# Patient Record
Sex: Female | Born: 1975 | Race: Black or African American | Hispanic: No | Marital: Single | Smoking: Former smoker
Health system: Southern US, Community
[De-identification: ages and names within clinical notes are randomized; demographics above are authoritative.]

## PROBLEM LIST (undated history)

## (undated) DIAGNOSIS — G8929 Other chronic pain: Secondary | ICD-10-CM

## (undated) DIAGNOSIS — E119 Type 2 diabetes mellitus without complications: Secondary | ICD-10-CM

## (undated) DIAGNOSIS — E785 Hyperlipidemia, unspecified: Secondary | ICD-10-CM

## (undated) DIAGNOSIS — F209 Schizophrenia, unspecified: Secondary | ICD-10-CM

## (undated) DIAGNOSIS — M549 Dorsalgia, unspecified: Secondary | ICD-10-CM

## (undated) HISTORY — PX: ABDOMINAL HYSTERECTOMY: SHX81

## (undated) HISTORY — PX: BACK SURGERY: SHX140

---

## 2008-01-21 ENCOUNTER — Inpatient Hospital Stay: Payer: Self-pay | Admitting: Psychiatry

## 2018-06-29 ENCOUNTER — Encounter (HOSPITAL_COMMUNITY): Payer: Self-pay | Admitting: Emergency Medicine

## 2018-06-29 ENCOUNTER — Ambulatory Visit (HOSPITAL_COMMUNITY)
Admission: EM | Admit: 2018-06-29 | Discharge: 2018-06-29 | Disposition: A | Discharge status: Home / Self Care | Payer: Medicare Other | Attending: Internal Medicine | Admitting: Internal Medicine

## 2018-06-29 DIAGNOSIS — M549 Dorsalgia, unspecified: Secondary | ICD-10-CM

## 2018-06-29 DIAGNOSIS — G8929 Other chronic pain: Secondary | ICD-10-CM

## 2018-06-29 HISTORY — DX: Other chronic pain: G89.29

## 2018-06-29 HISTORY — DX: Type 2 diabetes mellitus without complications: E11.9

## 2018-06-29 HISTORY — DX: Dorsalgia, unspecified: M54.9

## 2018-06-29 HISTORY — DX: Schizophrenia, unspecified: F20.9

## 2018-06-29 HISTORY — DX: Hyperlipidemia, unspecified: E78.5

## 2018-06-29 MED ORDER — KETOROLAC TROMETHAMINE 60 MG/2ML IM SOLN
INTRAMUSCULAR | Status: AC
  Filled 2018-06-29: qty 2

## 2018-06-29 MED ORDER — KETOROLAC TROMETHAMINE 60 MG/2ML IM SOLN
60.0000 mg | Freq: Once | INTRAMUSCULAR | Status: AC
  Administered 2018-06-29: 60 mg via INTRAMUSCULAR

## 2018-06-29 MED ORDER — CYCLOBENZAPRINE HCL 10 MG PO TABS: 10.0000 mg | ORAL_TABLET | Freq: Three times a day (TID) | ORAL | 0 refills | Status: DC

## 2018-06-29 NOTE — Discharge Instructions (Signed)
1- Take Tylenol 500 mg  2 every 6 hours for pain 2- You diabetes is not in good control, and the worse this is the more inflammation and pain you are going to have.  3- Use ice and heat on area of pain 20 minutes at a time 2-4 times a day 4- you need to walk more to help relieve your back pain, the more you sit, the more pressure there is on your discs and you back pain will just keep on getting worse.  5- call your family Dr and have a follow up on Friday.

## 2018-06-29 NOTE — ED Triage Notes (Signed)
Pt here for chronic lower back pain; pt sts toradol injection usually helps

## 2018-06-29 NOTE — ED Provider Notes (Signed)
[facility]    CSN: [id number] Arrival date & time: 06/29/18  1020     History   Chief Complaint Chief Complaint  Patient presents with  . Back Pain    HPI Melody Crawford is a 42 y.o. female.   Who presents with worse pain on lower back since last night. She denies an injury. Has not been able to get into her PCP today. Denies pain radiating to her legs.      Past Medical History:  Diagnosis Date  . Chronic back pain   . Diabetes mellitus without complication (HCC)   . Hyperlipidemia   . Schizophrenia (HCC)     There are no active problems to display for this patient.   Past Surgical History:  Procedure Laterality Date  . BACK SURGERY      OB History   None      Home Medications    Prior to Admission medications   Medication Sig Start Date End Date Taking? Authorizing Provider  GLIPIZIDE PO Take 2 tablets by mouth.   Yes [provider]  SITagliptin Phosphate (JANUVIA PO) Take by mouth.   Yes [provider]  cyclobenzaprine (FLEXERIL) 10 MG tablet Take 1 tablet (10 mg total) by mouth 3 (three) times daily. 06/29/18   Rodriguez-Southworth, Nettie Elm, PA-C    Family History History reviewed. No pertinent family history.  Social History Social History   Tobacco Use  . Smoking status: Current Every Day Smoker  . Smokeless tobacco: Never Used  Substance Use Topics  . Alcohol use: Not Currently  . Drug use: Never   Allergies   Patient has no known allergies.  Review of Systems Review of Systems  Constitutional: Negative for chills, diaphoresis and fever.  Gastrointestinal: Negative for abdominal pain.  Genitourinary: Negative for difficulty urinating, dysuria, frequency, hematuria, pelvic pain and urgency.  Musculoskeletal: Positive for back pain and myalgias.  Skin: Negative for color change, rash and wound.     Physical Exam Triage Vital Signs ED Triage Vitals [06/29/18 1129]  Enc Vitals Group     BP 138/79   Pulse Rate 84     Resp 18     Temp 98.1 F (36.7 C)     Temp Source Oral     SpO2 100 %     Weight      Height      Head Circumference      Peak Flow      Pain Score 10     Pain Loc      Pain Edu?      Excl. in GC?    No data found.  Updated Vital Signs BP 138/79 (BP Location: Right Arm)   Pulse 84   Temp 98.1 F (36.7 C) (Oral)   Resp 18   SpO2 100%   Visual Acuity Right Eye Distance:   Left Eye Distance:   Bilateral Distance:    Right Eye Near:   Left Eye Near:    Bilateral Near:     Physical Exam  Constitutional: She is oriented to person, place, and time. She appears well-developed. No distress.  HENT:  Head: Normocephalic.  Right Ear: External ear normal.  Left Ear: External ear normal.  Nose: Nose normal.  Eyes: Conjunctivae are normal. No scleral icterus.  Neck: Neck supple.  Pulmonary/Chest: Effort normal.  Musculoskeletal: She exhibits tenderness. She exhibits no edema or deformity.  BACK-Has a well healed scar on lumbar region. She is tender with just light pain  to lumbar soft tissue region. ROM is slow and decreased due to pain. Neg SLR.   Neurological: She is alert and oriented to person, place, and time. She displays normal reflexes. No sensory deficit. She exhibits normal muscle tone. Coordination normal.  Skin: Skin is warm. No rash noted. She is not diaphoretic. No erythema.  Psychiatric:  Has flat affect, would get easily irritated with he if I asked more details about her pain  Nursing note and vitals reviewed.  UC Treatments / Results  Labs (all labs ordered are listed, but only abnormal results are displayed) Labs Reviewed - No data to display  EKG None  Radiology No results found.  Procedures Procedures   Medications Ordered in UC Medications  ketorolac (TORADOL) injection 60 mg (60 mg Intramuscular Given 06/29/18 1230)    Initial Impression / Assessment and Plan / UC Course  I have reviewed the triage vital signs and the  nursing notes. Is having a flair if her chronic back pain and does not have any neurological deficit. I tried to find her med list from care everywhere but could not find it, I reviewed her labs and her last HGBA1C is 13.  She was given Toradol 60 mg IM. Rx for Flexeril was given. Advised to take Tylenol for pain. See instructions.     Final Clinical Impressions(s) / UC Diagnoses   Final diagnoses:  Chronic midline back pain, unspecified back location     Discharge Instructions     1- Take Tylenol 500 mg  2 every 6 hours for pain 2- You diabetes is not in good control, and the worse this is the more inflammation and pain you are going to have.  3- Use ice and heat on area of pain 20 minutes at a time 2-4 times a day 4- you need to walk more to help relieve your back pain, the more you sit, the more pressure there is on your discs and you back pain will just keep on getting worse.  5- call your family Dr and have a follow up on Friday.     ED Prescriptions    Medication Sig Dispense Auth. Provider   cyclobenzaprine (FLEXERIL) 10 MG tablet Take 1 tablet (10 mg total) by mouth 3 (three) times daily. 21 tablet Rodriguez-Southworth, Nettie ElmSylvia, PA-C     Controlled Substance Prescriptions [place] Controlled Substance Registry consulted?    Garey HamRodriguez-Southworth, Taige Housman, PA-C 06/29/18 2249

## 2019-12-22 ENCOUNTER — Encounter (HOSPITAL_COMMUNITY): Payer: Self-pay

## 2019-12-22 ENCOUNTER — Other Ambulatory Visit: Payer: Self-pay

## 2019-12-22 ENCOUNTER — Ambulatory Visit (HOSPITAL_COMMUNITY)
Admission: EM | Admit: 2019-12-22 | Discharge: 2019-12-22 | Disposition: A | Discharge status: Home / Self Care | Payer: Medicare Other | Attending: Family Medicine | Admitting: Family Medicine

## 2019-12-22 DIAGNOSIS — L0291 Cutaneous abscess, unspecified: Secondary | ICD-10-CM

## 2019-12-22 MED ORDER — CEPHALEXIN 500 MG PO CAPS: 500.0000 mg | ORAL_CAPSULE | Freq: Four times a day (QID) | ORAL | 0 refills | Status: DC

## 2019-12-22 NOTE — Discharge Instructions (Signed)
We drained a lot of infection of your abscess. Keep covered Placing on antibiotics Take medication as prescribed. Follow up as needed for continued or worsening symptoms

## 2019-12-22 NOTE — ED Triage Notes (Signed)
Pt presents with abscess under right arm since Saturday.

## 2019-12-23 DIAGNOSIS — L0291 Cutaneous abscess, unspecified: Secondary | ICD-10-CM | POA: Diagnosis not present

## 2019-12-23 NOTE — ED Provider Notes (Signed)
[facility]    CSN: [id number] Arrival date & time: 12/22/19  1115      History   Chief Complaint Chief Complaint  Patient presents with   Abscess    HPI Latoia Eyster is a 44 y.o. female.   Patient is a 44 year old female past medical history of chronic back pain, diabetes, hyperlipidemia, schizophrenia.  She presents today with abscess to right axilla.  Is been present and worsening over the past 5 days.  Very tender to palpation.  No specific drainage from the area.  Has not done any treat symptoms.  No fevers, chills, nausea, body aches.  Denies any known history of MRSA.  ROS per HPI      Past Medical History:  Diagnosis Date   Chronic back pain    Diabetes mellitus without complication (HCC)    Hyperlipidemia    Schizophrenia (HCC)     There are no problems to display for this patient.   Past Surgical History:  Procedure Laterality Date   BACK SURGERY      OB History   No obstetric history on file.      Home Medications    Prior to Admission medications   Medication Sig Start Date End Date Taking? Authorizing Provider  cephALEXin (KEFLEX) 500 MG capsule Take 1 capsule (500 mg total) by mouth 4 (four) times daily. 12/22/19   Dahlia Byes A, NP  cyclobenzaprine (FLEXERIL) 10 MG tablet Take 1 tablet (10 mg total) by mouth 3 (three) times daily. 06/29/18   Rodriguez-Southworth, Nettie Elm, PA-C  GLIPIZIDE PO Take 2 tablets by mouth.    [provider]  SITagliptin Phosphate (JANUVIA PO) Take by mouth.    [provider]    Family History Family History  Family history unknown: Yes    Social History Social History   Tobacco Use   Smoking status: Current Every Day Smoker   Smokeless tobacco: Never Used  Substance Use Topics   Alcohol use: Not Currently   Drug use: Never     Allergies   Patient has no known allergies.   Review of Systems Review of Systems   Physical Exam Triage Vital Signs ED Triage  Vitals  Enc Vitals Group     BP 12/22/19 1142 112/73     Pulse Rate 12/22/19 1142 84     Resp 12/22/19 1142 16     Temp 12/22/19 1142 98.7 F (37.1 C)     Temp Source 12/22/19 1142 Oral     SpO2 12/22/19 1142 100 %     Weight --      Height --      Head Circumference --      Peak Flow --      Pain Score 12/22/19 1144 8     Pain Loc --      Pain Edu? --      Excl. in GC? --    No data found.  Updated Vital Signs BP 112/73 (BP Location: Right Arm)    Pulse 84    Temp 98.7 F (37.1 C) (Oral)    Resp 16    SpO2 100%   Visual Acuity Right Eye Distance:   Left Eye Distance:   Bilateral Distance:    Right Eye Near:   Left Eye Near:    Bilateral Near:     Physical Exam Vitals and nursing note reviewed.  Constitutional:      General: She is not in acute distress.    Appearance: Normal  appearance. She is not ill-appearing, toxic-appearing or diaphoretic.  HENT:     Head: Normocephalic.     Nose: Nose normal.  Eyes:     Conjunctiva/sclera: Conjunctivae normal.  Pulmonary:     Effort: Pulmonary effort is normal.  Musculoskeletal:        General: Normal range of motion.     Cervical back: Normal range of motion.  Skin:    General: Skin is warm and dry.     Findings: No rash.     Comments: Approximated 5 to 6 cm x 4 cm abscess to right axilla with approximately 3 cm central fluctuance and surrounded induration and erythema.  No drainage.  Tender to palpation.  Neurological:     Mental Status: She is alert.  Psychiatric:        Mood and Affect: Mood normal.      UC Treatments / Results  Labs (all labs ordered are listed, but only abnormal results are displayed) Labs Reviewed - No data to display  EKG   Radiology No results found.  Procedures Incision and Drainage  Date/Time: 12/23/2019 11:29 AM Performed by: Janace Aris, NP Authorized by: Janace Aris, NP   Consent:    Consent obtained:  Verbal   Consent given by:  Patient   Risks discussed:   Bleeding, incomplete drainage, pain and damage to other organs   Alternatives discussed:  No treatment Universal protocol:    Patient identity confirmed:  Verbally with patient Location:    Type:  Abscess   Size:  5   Location:  Upper extremity   Upper extremity location:  Arm   Arm location:  R upper arm Pre-procedure details:    Skin preparation:  Betadine Anesthesia (see MAR for exact dosages):    Anesthesia method:  Local infiltration   Local anesthetic:  Lidocaine 2% w/o epi Procedure type:    Complexity:  Simple Procedure details:    Incision types:  Single straight   Incision depth:  Subcutaneous   Scalpel blade:  11   Wound management:  Probed and deloculated   Drainage:  Purulent   Drainage amount:  Copious   Wound treatment:  Wound left open   Packing materials:  None Post-procedure details:    Patient tolerance of procedure:  Tolerated well, no immediate complications   (including critical care time)  Medications Ordered in UC Medications - No data to display  Initial Impression / Assessment and Plan / UC Course  I have reviewed the triage vital signs and the nursing notes.  Pertinent labs & imaging results that were available during my care of the patient were reviewed by me and considered in my medical decision making (see chart for details).     Abscess Abscess I&D here in clinic with large amount of purulent drainage Patient tolerated well. Will place on Keflex for antibiotic coverage and have her follow-up for any continued or worsening problems Final Clinical Impressions(s) / UC Diagnoses   Final diagnoses:  Abscess     Discharge Instructions     We drained a lot of infection of your abscess. Keep covered Placing on antibiotics Take medication as prescribed. Follow up as needed for continued or worsening symptoms     ED Prescriptions    Medication Sig Dispense Auth. Provider   cephALEXin (KEFLEX) 500 MG capsule Take 1 capsule (500 mg  total) by mouth 4 (four) times daily. 28 capsule Khaza Blansett A, NP     PDMP not reviewed this  encounter.   Orvan July, NP 12/23/19 1130

## 2020-03-05 ENCOUNTER — Encounter (HOSPITAL_COMMUNITY): Payer: Self-pay

## 2020-03-05 ENCOUNTER — Other Ambulatory Visit: Payer: Self-pay

## 2020-03-05 ENCOUNTER — Ambulatory Visit (HOSPITAL_COMMUNITY)
Admission: EM | Admit: 2020-03-05 | Discharge: 2020-03-05 | Disposition: A | Discharge status: Home / Self Care | Payer: Medicare HMO | Attending: Urgent Care | Admitting: Urgent Care

## 2020-03-05 DIAGNOSIS — R300 Dysuria: Secondary | ICD-10-CM | POA: Insufficient documentation

## 2020-03-05 DIAGNOSIS — Z3202 Encounter for pregnancy test, result negative: Secondary | ICD-10-CM

## 2020-03-05 DIAGNOSIS — N898 Other specified noninflammatory disorders of vagina: Secondary | ICD-10-CM | POA: Diagnosis not present

## 2020-03-05 LAB — POCT URINALYSIS DIPSTICK, ED / UC
Bilirubin Urine: NEGATIVE
Glucose, UA: NEGATIVE mg/dL
Hgb urine dipstick: NEGATIVE
Ketones, ur: NEGATIVE mg/dL
Leukocytes,Ua: NEGATIVE
Nitrite: NEGATIVE
Protein, ur: 30 mg/dL — AB
Specific Gravity, Urine: >=1.03 (ref 1.005–1.030)
Urobilinogen, UA: 1 mg/dL (ref 0.0–1.0)
pH: 5.5 (ref 5.0–8.0)

## 2020-03-05 LAB — POC URINE PREG, ED: Preg Test, Ur: NEGATIVE

## 2020-03-05 MED ORDER — PHENAZOPYRIDINE HCL 200 MG PO TABS: 200.0000 mg | ORAL_TABLET | Freq: Three times a day (TID) | ORAL | 0 refills | Status: DC | PRN

## 2020-03-05 MED ORDER — METRONIDAZOLE 0.75 % VA GEL: 1.0000 | Freq: Two times a day (BID) | VAGINAL | 0 refills | Status: DC

## 2020-03-05 NOTE — ED Provider Notes (Signed)
[facility]   MRN: [id number] DOB: 1975-07-23  Subjective:   Melody Crawford is a 44 y.o. female presenting for 2 to 3-week history of persistent dysuria, vaginal itching, internal genital and vaginal irritation and slight discharge. Patient states that she usually has problems with yeast or BV. Has used Diflucan on Monday but states that her symptoms persist. Patient is a diabetic, states that she tries to hydrate very well but admits that she drinks 1-2 sodas a day, also drinks Powerade and another energy drink. She doesn't know of those are sugar-free either. States that she has no concerns about a sexually transmitted infection but is not opposed to testing.  No current facility-administered medications for this encounter.  Current Outpatient Medications:  .  cephALEXin (KEFLEX) 500 MG capsule, Take 1 capsule (500 mg total) by mouth 4 (four) times daily., Disp: 28 capsule, Rfl: 0 .  cyclobenzaprine (FLEXERIL) 10 MG tablet, Take 1 tablet (10 mg total) by mouth 3 (three) times daily., Disp: 21 tablet, Rfl: 0 .  GLIPIZIDE PO, Take 2 tablets by mouth., Disp: , Rfl:  .  SITagliptin Phosphate (JANUVIA PO), Take by mouth., Disp: , Rfl:    No Known Allergies  Past Medical History:  Diagnosis Date  . Chronic back pain   . Diabetes mellitus without complication (HCC)   . Hyperlipidemia   . Schizophrenia [facility])      Past Surgical History:  Procedure Laterality Date  . BACK SURGERY      Family History  Family history unknown: Yes    Social History   Tobacco Use  . Smoking status: Current Every Day Smoker  . Smokeless tobacco: Never Used  Substance Use Topics  . Alcohol use: Not Currently  . Drug use: Never    ROS   Objective:   Vitals: BP 105/67 (BP Location: Right Arm)   Pulse 91   Temp 98.6 F (37 C) (Oral)   Resp 16   SpO2 98%   Physical Exam Constitutional:      General: She is not in acute distress.    Appearance: Normal appearance. She is  well-developed. She is obese. She is not ill-appearing, toxic-appearing or diaphoretic.  HENT:     Head: Normocephalic and atraumatic.     Right Ear: External ear normal.     Left Ear: External ear normal.     Nose: Nose normal.     Mouth/Throat:     Mouth: Mucous membranes are moist.     Pharynx: Oropharynx is clear.  Eyes:     General: No scleral icterus.    Extraocular Movements: Extraocular movements intact.     Pupils: Pupils are equal, round, and reactive to light.  Cardiovascular:     Rate and Rhythm: Normal rate and regular rhythm.     Heart sounds: Normal heart sounds. No murmur heard.  No friction rub. No gallop.   Pulmonary:     Effort: Pulmonary effort is normal. No respiratory distress.     Breath sounds: Normal breath sounds. No stridor. No wheezing, rhonchi or rales.  Abdominal:     General: Bowel sounds are normal. There is no distension.     Palpations: Abdomen is soft. There is no mass.     Tenderness: There is abdominal tenderness (lower abdomen, generalized). There is no right CVA tenderness, left CVA tenderness, guarding or rebound.  Skin:    General: Skin is warm and dry.     Coloration: Skin is not pale.  Findings: No rash.  Neurological:     General: No focal deficit present.     Mental Status: She is alert and oriented to person, place, and time.  Psychiatric:        Mood and Affect: Mood normal.        Behavior: Behavior normal.        Thought Content: Thought content normal.        Judgment: Judgment normal.     Results for orders placed or performed during the hospital encounter of 03/05/20 (from the past 24 hour(s))  POC Urinalysis dipstick     Status: Abnormal   Collection Time: 03/05/20 11:13 AM  Result Value Ref Range   Glucose, UA NEGATIVE NEGATIVE mg/dL   Bilirubin Urine NEGATIVE NEGATIVE   Ketones, ur NEGATIVE NEGATIVE mg/dL   Specific Gravity, Urine >=1.030 1.005 - 1.030   Hgb urine dipstick NEGATIVE NEGATIVE   pH 5.5 5.0 - 8.0    Protein, ur 30 (A) NEGATIVE mg/dL   Urobilinogen, UA 1.0 0.0 - 1.0 mg/dL   Nitrite NEGATIVE NEGATIVE   Leukocytes,Ua NEGATIVE NEGATIVE  POC urine pregnancy     Status: None   Collection Time: 03/05/20 11:19 AM  Result Value Ref Range   Preg Test, Ur NEGATIVE NEGATIVE    Assessment and Plan :   PDMP not reviewed this encounter.  1. Vaginal itching   2. Dysuria     Emphasized need to cut out all urinary irritants including sodas, energy drinks. She is to hydrate with plain water only. Use Pyridium in the meantime for symptomatic relief. Given that she already try Diflucan will cover for bacterial vaginosis with [employer] at patient's request. Urine culture and STI testing, cervical swab testing pending. Counseled patient on potential for adverse effects with medications prescribed/recommended today, ER and return-to-clinic precautions discussed, patient verbalized understanding. Follow-up with PCP otherwise.    Wallis Bamberg, PA-C 03/05/20 1136

## 2020-03-05 NOTE — Discharge Instructions (Signed)
Make sure you hydrate very well with plain water and a quantity of 64 ounces of water a day.  Please limit drinks that are considered urinary irritants such as soda, sweet tea, coffee, energy drinks, alcohol.  These can worsen your symptoms and also be the source of them.  I will let you know about your urine culture and cervical swab results through MyChart to see if we need your antibiotics based off of those results.

## 2020-03-05 NOTE — ED Triage Notes (Signed)
Patient is here today with complaints of dysuria, burning urination and vaginal itching that has been oncoming for the past 2-3 weeks. Patient states her PCP Rx Diflucan 150 mg x 2 on Monday but she is still having the same symptoms.

## 2020-03-06 LAB — CERVICOVAGINAL ANCILLARY ONLY
Bacterial Vaginitis (gardnerella): POSITIVE — AB
Candida Glabrata: POSITIVE — AB
Candida Vaginitis: NEGATIVE
Chlamydia: NEGATIVE
Comment: NEGATIVE
Comment: NEGATIVE
Comment: NEGATIVE
Comment: NEGATIVE
Comment: NEGATIVE
Comment: NORMAL
Neisseria Gonorrhea: NEGATIVE
Trichomonas: NEGATIVE

## 2020-03-06 LAB — URINE CULTURE: Culture: 30000 — AB

## 2020-03-08 ENCOUNTER — Telehealth (HOSPITAL_COMMUNITY): Payer: Self-pay | Admitting: Emergency Medicine

## 2020-03-08 MED ORDER — FLUCONAZOLE 150 MG PO TABS: 150.0000 mg | ORAL_TABLET | Freq: Once | ORAL | 0 refills | Status: AC

## 2020-03-31 ENCOUNTER — Other Ambulatory Visit: Payer: Self-pay

## 2020-03-31 ENCOUNTER — Emergency Department (HOSPITAL_COMMUNITY): Payer: Medicare HMO

## 2020-03-31 ENCOUNTER — Emergency Department (HOSPITAL_COMMUNITY)
Admission: EM | Admit: 2020-03-31 | Discharge: 2020-03-31 | Disposition: A | Discharge status: Home / Self Care | Payer: Medicare HMO | Attending: Emergency Medicine | Admitting: Emergency Medicine

## 2020-03-31 ENCOUNTER — Encounter (HOSPITAL_COMMUNITY): Payer: Self-pay

## 2020-03-31 ENCOUNTER — Encounter (HOSPITAL_COMMUNITY): Payer: Self-pay | Admitting: *Deleted

## 2020-03-31 ENCOUNTER — Ambulatory Visit (HOSPITAL_COMMUNITY)
Admission: EM | Admit: 2020-03-31 | Discharge: 2020-03-31 | Disposition: A | Discharge status: Other Acute Inpatient Hospital | Payer: Medicare HMO

## 2020-03-31 DIAGNOSIS — Z5321 Procedure and treatment not carried out due to patient leaving prior to being seen by health care provider: Secondary | ICD-10-CM | POA: Insufficient documentation

## 2020-03-31 DIAGNOSIS — R32 Unspecified urinary incontinence: Secondary | ICD-10-CM

## 2020-03-31 DIAGNOSIS — R1084 Generalized abdominal pain: Secondary | ICD-10-CM | POA: Diagnosis not present

## 2020-03-31 DIAGNOSIS — M5442 Lumbago with sciatica, left side: Secondary | ICD-10-CM | POA: Diagnosis not present

## 2020-03-31 DIAGNOSIS — R200 Anesthesia of skin: Secondary | ICD-10-CM | POA: Insufficient documentation

## 2020-03-31 DIAGNOSIS — M7918 Myalgia, other site: Secondary | ICD-10-CM | POA: Insufficient documentation

## 2020-03-31 DIAGNOSIS — M48061 Spinal stenosis, lumbar region without neurogenic claudication: Secondary | ICD-10-CM

## 2020-03-31 DIAGNOSIS — G8929 Other chronic pain: Secondary | ICD-10-CM
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
## 2020-03-31 NOTE — ED Notes (Signed)
Patient is being discharged from the Urgent Care and sent to the Emergency Department via wheel chair w/UC staff . Per Leta Jungling, pa, patient is in need of higher level of care due to needing further testing. Patient is aware and verbalizes understanding of plan of care.  Vitals:   03/31/20 0918  BP: 112/77  Pulse: 82  Resp: 18  Temp: 97.9 F (36.6 C)  SpO2: 100%
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
## 2020-03-31 NOTE — ED Notes (Signed)
Pt extremely angry and aggressive with staff about wait time. Pt explained ED process, pt states she is going to UC. Pt LWBS

## 2020-03-31 NOTE — ED Notes (Signed)
Pt up to desk, verbally aggressive with staff. Pt informed of wait time and said that she will be leaving to go to urgent care to get her results since they are "in the system".
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
## 2020-04-01 ENCOUNTER — Other Ambulatory Visit: Payer: Self-pay

## 2020-04-01 ENCOUNTER — Encounter (HOSPITAL_COMMUNITY): Payer: Self-pay | Admitting: Emergency Medicine

## 2020-04-01 ENCOUNTER — Emergency Department (HOSPITAL_COMMUNITY)
Admission: EM | Admit: 2020-04-01 | Discharge: 2020-04-01 | Disposition: A | Discharge status: Home / Self Care | Payer: Medicare HMO | Attending: Emergency Medicine | Admitting: Emergency Medicine

## 2020-04-01 DIAGNOSIS — M79605 Pain in left leg: Secondary | ICD-10-CM | POA: Insufficient documentation

## 2020-04-01 DIAGNOSIS — Y999 Unspecified external cause status: Secondary | ICD-10-CM | POA: Insufficient documentation

## 2020-04-01 DIAGNOSIS — E119 Type 2 diabetes mellitus without complications: Secondary | ICD-10-CM | POA: Insufficient documentation

## 2020-04-01 DIAGNOSIS — Y92410 Unspecified street and highway as the place of occurrence of the external cause: Secondary | ICD-10-CM | POA: Insufficient documentation

## 2020-04-01 DIAGNOSIS — F17210 Nicotine dependence, cigarettes, uncomplicated: Secondary | ICD-10-CM | POA: Insufficient documentation

## 2020-04-01 DIAGNOSIS — M545 Low back pain: Secondary | ICD-10-CM | POA: Diagnosis not present

## 2020-04-01 DIAGNOSIS — R200 Anesthesia of skin: Secondary | ICD-10-CM | POA: Diagnosis not present

## 2020-04-01 DIAGNOSIS — Y939 Activity, unspecified: Secondary | ICD-10-CM | POA: Diagnosis not present

## 2020-04-01 DIAGNOSIS — Z7984 Long term (current) use of oral hypoglycemic drugs: Secondary | ICD-10-CM | POA: Diagnosis not present

## 2020-04-01 DIAGNOSIS — M5106 Intervertebral disc disorders with myelopathy, lumbar region: Secondary | ICD-10-CM

## 2020-04-01 LAB — URINALYSIS, ROUTINE W REFLEX MICROSCOPIC
Bilirubin Urine: NEGATIVE
Glucose, UA: >=500 mg/dL — AB
Hgb urine dipstick: NEGATIVE
Ketones, ur: NEGATIVE mg/dL
Leukocytes,Ua: NEGATIVE
Nitrite: NEGATIVE
Protein, ur: NEGATIVE mg/dL
Specific Gravity, Urine: 1.02 (ref 1.005–1.030)
pH: 6 (ref 5.0–8.0)

## 2020-04-01 LAB — URINALYSIS, MICROSCOPIC (REFLEX): RBC / HPF: NONE SEEN RBC/hpf (ref 0–5)

## 2020-04-01 MED ORDER — KETOROLAC TROMETHAMINE 15 MG/ML IJ SOLN
30.0000 mg | Freq: Once | INTRAMUSCULAR | Status: AC
  Administered 2020-04-01: 30 mg via INTRAMUSCULAR
  Filled 2020-04-01: qty 2

## 2020-04-01 MED ORDER — ORPHENADRINE CITRATE 30 MG/ML IJ SOLN
60.0000 mg | Freq: Two times a day (BID) | INTRAMUSCULAR | Status: DC
  Filled 2020-04-01: qty 2

## 2020-04-01 MED ORDER — METHOCARBAMOL 1000 MG/10ML IJ SOLN
500.0000 mg | Freq: Once | INTRAMUSCULAR | Status: AC
  Administered 2020-04-01: 500 mg via INTRAMUSCULAR
  Filled 2020-04-01 (×2): qty 5

## 2020-04-01 MED ORDER — METHOCARBAMOL 500 MG PO TABS: 500.0000 mg | ORAL_TABLET | Freq: Three times a day (TID) | ORAL | 0 refills | Status: DC | PRN
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
## 2020-04-01 NOTE — ED Triage Notes (Signed)
Pt. Stated, I was here yesterday and I left too long to wait.  Left AMA
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
## 2020-10-21 ENCOUNTER — Other Ambulatory Visit: Payer: Self-pay

## 2020-10-21 ENCOUNTER — Encounter (HOSPITAL_COMMUNITY): Payer: Self-pay | Admitting: *Deleted

## 2020-10-21 ENCOUNTER — Ambulatory Visit (HOSPITAL_COMMUNITY)
Admission: EM | Admit: 2020-10-21 | Discharge: 2020-10-21 | Disposition: A | Discharge status: Home / Self Care | Payer: Medicare Other | Attending: Emergency Medicine | Admitting: Emergency Medicine

## 2020-10-21 DIAGNOSIS — N898 Other specified noninflammatory disorders of vagina: Secondary | ICD-10-CM

## 2020-10-21 DIAGNOSIS — E1169 Type 2 diabetes mellitus with other specified complication: Secondary | ICD-10-CM | POA: Diagnosis not present

## 2020-10-21 DIAGNOSIS — R81 Glycosuria: Secondary | ICD-10-CM

## 2020-10-21 DIAGNOSIS — Z3202 Encounter for pregnancy test, result negative: Secondary | ICD-10-CM | POA: Diagnosis not present

## 2020-10-21 LAB — POCT URINALYSIS DIPSTICK, ED / UC
Bilirubin Urine: NEGATIVE
Glucose, UA: >=1000 mg/dL — AB
Hgb urine dipstick: NEGATIVE
Ketones, ur: NEGATIVE mg/dL
Leukocytes,Ua: NEGATIVE
Nitrite: NEGATIVE
Protein, ur: NEGATIVE mg/dL
Specific Gravity, Urine: 1.015 (ref 1.005–1.030)
Urobilinogen, UA: 0.2 mg/dL (ref 0.0–1.0)
pH: 5.5 (ref 5.0–8.0)

## 2020-10-21 LAB — CBG MONITORING, ED: Glucose-Capillary: 407 mg/dL — ABNORMAL HIGH (ref 70–99)

## 2020-10-21 LAB — POC URINE PREG, ED: Preg Test, Ur: NEGATIVE

## 2020-10-21 NOTE — Discharge Instructions (Signed)
Your discomfort is most likely due to the amount of sugar in your urine.    Take your glucose medication as prescribed.  Follow up with your primary care doctor.    If your lab work reveals any other infections, we will contact you and send in medication at that time.    Follow up with your Primary Care Provider or go to the Emergency Department if symptoms worsen or do not improve in the next few days.

## 2020-10-21 NOTE — ED Triage Notes (Signed)
Pt reports seeing a vag DC,itching.

## 2020-10-21 NOTE — ED Provider Notes (Signed)
[facility]    CSN: [id number] Arrival date & time: 10/21/20  1005      History   Chief Complaint Chief Complaint  Patient presents with  . Vaginal Discharge    HPI Melody Crawford is a 45 y.o. female.   Patient here with complaint of burning with urination and vaginal itching.  Patient with history of diabetes but is noncompliant with medications.  Has not checked blood sugars today.  Has not taken any OTC medications or treatments. Denies any specific alleviating or aggravating factors.  Denies any fevers, chest pain, shortness of breath, N/V/D, numbness, tingling, weakness, abdominal pain, or headaches.   ROS: As per HPI, all other pertinent ROS negative      Past Medical History:  Diagnosis Date  . Chronic back pain   . Diabetes mellitus without complication (HCC)   . Hyperlipidemia   . Schizophrenia (HCC)     There are no problems to display for this patient.   Past Surgical History:  Procedure Laterality Date  . BACK SURGERY      OB History   No obstetric history on file.      Home Medications    Prior to Admission medications   Medication Sig Start Date End Date Taking? Authorizing Provider  atorvastatin (LIPITOR) 20 MG tablet Take 1 mg by mouth daily. 01/07/20 01/06/21  [provider]  cyclobenzaprine (FLEXERIL) 10 MG tablet Take 1 tablet (10 mg total) by mouth 3 (three) times daily. 06/29/18   Rodriguez-Southworth, Nettie Elm, PA-C  GLIPIZIDE PO Take 2 tablets by mouth.    [provider]  INVEGA SUSTENNA 156 MG/ML SUSY injection Inject 156 mg into the muscle as directed. 03/01/20   [provider]  methocarbamol (ROBAXIN) 500 MG tablet Take 1 tablet (500 mg total) by mouth 3 (three) times daily as needed for muscle spasms. 04/01/20   Harris, Cammy Copa, PA-C  metroNIDAZOLE (METROGEL VAGINAL) 0.75 % vaginal gel Place 1 Applicatorful vaginally 2 (two) times daily. 03/05/20   Wallis Bamberg, PA-C  phenazopyridine (PYRIDIUM) 200 MG  tablet Take 1 tablet (200 mg total) by mouth 3 (three) times daily as needed for pain. 03/05/20   Wallis Bamberg, PA-C  SITagliptin Phosphate (JANUVIA PO) Take by mouth.    [provider]    Family History Family History  Problem Relation Age of Onset  . Kidney disease Father     Social History Social History   Tobacco Use  . Smoking status: Current Every Day Smoker    Packs/day: 1.00    Types: Cigarettes  . Smokeless tobacco: Never Used  Vaping Use  . Vaping Use: Never used  Substance Use Topics  . Alcohol use: Not Currently  . Drug use: Never     Allergies   Patient has no known allergies.   Review of Systems Review of Systems  Genitourinary: Positive for dysuria, vaginal discharge and vaginal pain.     Physical Exam Triage Vital Signs ED Triage Vitals  Enc Vitals Group     BP 10/21/20 1029 (!) 148/80     Pulse Rate 10/21/20 1029 79     Resp 10/21/20 1029 18     Temp 10/21/20 1029 98.2 F (36.8 C)     Temp Source 10/21/20 1029 Oral     SpO2 10/21/20 1029 99 %     Weight --      Height --      Head Circumference --      Peak Flow --  Pain Score 10/21/20 1030 10     Pain Loc --      Pain Edu? --      Excl. in GC? --    No data found.  Updated Vital Signs BP (!) 148/80 (BP Location: Right Arm)   Pulse 79   Temp 98.2 F (36.8 C) (Oral)   Resp 18   SpO2 99%   Visual Acuity Right Eye Distance:   Left Eye Distance:   Bilateral Distance:    Right Eye Near:   Left Eye Near:    Bilateral Near:     Physical Exam Vitals and nursing note reviewed.  Constitutional:      General: She is not in acute distress.    Appearance: Normal appearance. She is not ill-appearing, toxic-appearing or diaphoretic.  HENT:     Head: Normocephalic and atraumatic.  Eyes:     Conjunctiva/sclera: Conjunctivae normal.  Cardiovascular:     Rate and Rhythm: Normal rate.     Pulses: Normal pulses.  Pulmonary:     Effort: Pulmonary effort is normal.   Abdominal:     General: Abdomen is flat.  Genitourinary:    Comments: decline Musculoskeletal:        General: Normal range of motion.     Cervical back: Normal range of motion.  Skin:    General: Skin is warm and dry.  Neurological:     General: No focal deficit present.     Mental Status: She is alert and oriented to person, place, and time.  Psychiatric:        Mood and Affect: Mood normal.      UC Treatments / Results  Labs (all labs ordered are listed, but only abnormal results are displayed) Labs Reviewed  POCT URINALYSIS DIPSTICK, ED / UC - Abnormal; Notable for the following components:      Result Value   Glucose, UA >=1000 (*)    All other components within normal limits  CBG MONITORING, ED - Abnormal; Notable for the following components:   Glucose-Capillary 407 (*)    All other components within normal limits  POC URINE PREG, ED  CERVICOVAGINAL ANCILLARY ONLY    EKG   Radiology No results found.  Procedures Procedures (including critical care time)  Medications Ordered in UC Medications - No data to display  Initial Impression / Assessment and Plan / UC Course  I have reviewed the triage vital signs and the nursing notes.  Pertinent labs & imaging results that were available during my care of the patient were reviewed by me and considered in my medical decision making (see chart for details).     Vaginal itching Glucosuria Assessment negative for red flags or concerns. CBG 407 in office but has not taken medications this morning.  Patient reports does not take medications regularly, last A1c was 13. Urinalysis with glucose greater than 1000.  Likely cause of vaginal discomfort. Self swab obtained.  Will treat based on results. Encourage patient to take medications regularly as prescribed, which will help with glucosuria and discomfort. Follow-up with primary care Final Clinical Impressions(s) / UC Diagnoses   Final diagnoses:  Vaginal  itching  Glucosuria     Discharge Instructions     Your discomfort is most likely due to the amount of sugar in your urine.    Take your glucose medication as prescribed.  Follow up with your primary care doctor.    If your lab work reveals any other infections, we will contact you  and send in medication at that time.    Follow up with your Primary Care Provider or go to the Emergency Department if symptoms worsen or do not improve in the next few days.      ED Prescriptions    None     PDMP not reviewed this encounter.   Ivette Loyal, NP 10/21/20 [phone]

## 2020-10-22 LAB — CERVICOVAGINAL ANCILLARY ONLY
Bacterial Vaginitis (gardnerella): NEGATIVE
Candida Glabrata: POSITIVE — AB
Candida Vaginitis: POSITIVE — AB
Chlamydia: NEGATIVE
Comment: NEGATIVE
Comment: NEGATIVE
Comment: NEGATIVE
Comment: NEGATIVE
Comment: NEGATIVE
Comment: NORMAL
Neisseria Gonorrhea: NEGATIVE
Trichomonas: NEGATIVE

## 2020-10-23 ENCOUNTER — Telehealth (HOSPITAL_COMMUNITY): Payer: Self-pay | Admitting: Emergency Medicine

## 2020-10-23 MED ORDER — FLUCONAZOLE 150 MG PO TABS: 150.0000 mg | ORAL_TABLET | Freq: Once | ORAL | 0 refills | Status: AC

## 2021-06-09 ENCOUNTER — Ambulatory Visit (HOSPITAL_COMMUNITY)
Admission: EM | Admit: 2021-06-09 | Discharge: 2021-06-09 | Disposition: A | Discharge status: Home / Self Care | Payer: Medicare Other | Attending: Physician Assistant | Admitting: Physician Assistant

## 2021-06-09 ENCOUNTER — Other Ambulatory Visit: Payer: Self-pay

## 2021-06-09 ENCOUNTER — Encounter (HOSPITAL_COMMUNITY): Payer: Self-pay | Admitting: Emergency Medicine

## 2021-06-09 DIAGNOSIS — G8929 Other chronic pain: Secondary | ICD-10-CM

## 2021-06-09 DIAGNOSIS — M545 Low back pain, unspecified: Secondary | ICD-10-CM | POA: Diagnosis not present

## 2021-06-09 MED ORDER — KETOROLAC TROMETHAMINE 30 MG/ML IJ SOLN
30.0000 mg | Freq: Once | INTRAMUSCULAR | Status: AC
  Administered 2021-06-09: 30 mg via INTRAMUSCULAR

## 2021-06-09 MED ORDER — METHOCARBAMOL 500 MG PO TABS: 500.0000 mg | ORAL_TABLET | Freq: Three times a day (TID) | ORAL | 0 refills | Status: DC | PRN

## 2021-06-09 MED ORDER — KETOROLAC TROMETHAMINE 30 MG/ML IJ SOLN
INTRAMUSCULAR | Status: AC
  Filled 2021-06-09: qty 1

## 2021-06-09 NOTE — Discharge Instructions (Signed)
We gave you a shot of Toradol today so please avoid NSAIDs including aspirin, ibuprofen/Advil, naproxen/Aleve for 24 hours.  You can use Tylenol for breakthrough pain.  Start methocarbamol up to 3 times a day.  This make you sleepy so not drive drink alcohol with taking it.  Use heat, rest, stretch for additional symptom relief.  If you develop any worsening symptoms please return for reevaluation.  Recommend follow-up with your primary care provider first thing next week for evaluation and additional management.

## 2021-06-09 NOTE — ED Triage Notes (Signed)
Pt is present today with lower back pain. Pt states sx started Friday. Pt denies any urinary sx
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
## 2022-03-25 ENCOUNTER — Ambulatory Visit (HOSPITAL_COMMUNITY)
Admission: EM | Admit: 2022-03-25 | Discharge: 2022-03-25 | Disposition: A | Discharge status: Home / Self Care | Payer: Medicare Other | Attending: Physician Assistant | Admitting: Physician Assistant

## 2022-03-25 ENCOUNTER — Encounter (HOSPITAL_COMMUNITY): Payer: Self-pay

## 2022-03-25 DIAGNOSIS — R350 Frequency of micturition: Secondary | ICD-10-CM | POA: Diagnosis present

## 2022-03-25 LAB — POCT URINALYSIS DIPSTICK, ED / UC
Glucose, UA: >=1000 mg/dL — AB
Hgb urine dipstick: NEGATIVE
Ketones, ur: 80 mg/dL — AB
Leukocytes,Ua: NEGATIVE
Nitrite: NEGATIVE
Protein, ur: NEGATIVE mg/dL
Specific Gravity, Urine: 1.025 (ref 1.005–1.030)
Urobilinogen, UA: 0.2 mg/dL (ref 0.0–1.0)
pH: 6 (ref 5.0–8.0)

## 2022-03-25 LAB — CBG MONITORING, ED: Glucose-Capillary: 275 mg/dL — ABNORMAL HIGH (ref 70–99)

## 2022-03-25 NOTE — ED Triage Notes (Signed)
Pt is here for possible UTI , pt states she has urgency and frequency with pressure and back pain x7days

## 2022-03-25 NOTE — Discharge Instructions (Signed)
Drink plenty of water Follow up with primary care physician to continue working on better control of diabetes. Return for evaluation if you develop fever, nausea, vomiting.  Will call with lab results.

## 2022-03-25 NOTE — ED Provider Notes (Signed)
[facility]    CSN: [id number] Arrival date & time: 03/25/22  1552      History   Chief Complaint Chief Complaint  Patient presents with   Dysuria    HPI Laquonda Welby is a 46 y.o. female.   Pt complains of increased urinary frequency and urgency, lower back pain that started about 7 days ago.  Denies fever, chills, n/v/d.  She has chronic lower back pain, but is worried she may have a kidney infection.  She has poorly controlled diabetes with last A1C 11.9.  Has been referred to endocrinology.  She reports checking her sugar at home with readings in the 200-300s.     Past Medical History:  Diagnosis Date   Chronic back pain    Diabetes mellitus without complication (HCC)    Hyperlipidemia    Schizophrenia (HCC)     There are no problems to display for this patient.   Past Surgical History:  Procedure Laterality Date   BACK SURGERY      OB History   No obstetric history on file.      Home Medications    Prior to Admission medications   Medication Sig Start Date End Date Taking? Authorizing Provider  atorvastatin (LIPITOR) 20 MG tablet Take 1 mg by mouth daily. 01/07/20 01/06/21  [provider]  GLIPIZIDE PO Take 2 tablets by mouth.    [provider]  INVEGA SUSTENNA 156 MG/ML SUSY injection Inject 156 mg into the muscle as directed. 03/01/20   [provider]  methocarbamol (ROBAXIN) 500 MG tablet Take 1 tablet (500 mg total) by mouth every 8 (eight) hours as needed for muscle spasms. 06/09/21   Raspet, Noberto Retort, PA-C  phenazopyridine (PYRIDIUM) 200 MG tablet Take 1 tablet (200 mg total) by mouth 3 (three) times daily as needed for pain. 03/05/20   Wallis Bamberg, PA-C  SITagliptin Phosphate (JANUVIA PO) Take by mouth.    [provider]    Family History Family History  Problem Relation Age of Onset   Kidney disease Father     Social History Social History   Tobacco Use   Smoking status: Every Day    Packs/day:  1.00    Types: Cigarettes   Smokeless tobacco: Never  Vaping Use   Vaping Use: Never used  Substance Use Topics   Alcohol use: Not Currently   Drug use: Never     Allergies   Patient has no known allergies.   Review of Systems Review of Systems  Constitutional:  Negative for chills and fever.  HENT:  Negative for ear pain and sore throat.   Eyes:  Negative for pain and visual disturbance.  Respiratory:  Negative for cough and shortness of breath.   Cardiovascular:  Negative for chest pain and palpitations.  Gastrointestinal:  Negative for abdominal pain and vomiting.  Genitourinary:  Positive for frequency and urgency. Negative for dysuria and hematuria.  Musculoskeletal:  Positive for back pain. Negative for arthralgias.  Skin:  Negative for color change and rash.  Neurological:  Negative for seizures and syncope.  All other systems reviewed and are negative.    Physical Exam Triage Vital Signs ED Triage Vitals  Enc Vitals Group     BP 03/25/22 1743 117/66     Pulse Rate 03/25/22 1743 81     Resp 03/25/22 1743 12     Temp 03/25/22 1743 98.8 F (37.1 C)     Temp Source 03/25/22 1743 Oral  SpO2 03/25/22 1743 99 %     Weight 03/25/22 1739 189 lb (85.7 kg)     Height 03/25/22 1739 5\' 5"  (1.651 m)     Head Circumference --      Peak Flow --      Pain Score 03/25/22 1739 10     Pain Loc --      Pain Edu? --      Excl. in GC? --    No data found.  Updated Vital Signs BP 117/66 (BP Location: Left Arm)   Pulse 81   Temp 98.8 F (37.1 C) (Oral)   Resp 12   Ht 5\' 5"  (1.651 m)   Wt 189 lb (85.7 kg)   SpO2 99%   BMI 31.45 kg/m   Visual Acuity Right Eye Distance:   Left Eye Distance:   Bilateral Distance:    Right Eye Near:   Left Eye Near:    Bilateral Near:     Physical Exam Vitals and nursing note reviewed.  Constitutional:      General: She is not in acute distress.    Appearance: She is well-developed.  HENT:     Head: Normocephalic and  atraumatic.  Eyes:     Conjunctiva/sclera: Conjunctivae normal.  Cardiovascular:     Rate and Rhythm: Normal rate and regular rhythm.     Heart sounds: No murmur heard. Pulmonary:     Effort: Pulmonary effort is normal. No respiratory distress.     Breath sounds: Normal breath sounds.  Abdominal:     Palpations: Abdomen is soft.     Tenderness: There is no abdominal tenderness.  Musculoskeletal:        General: No swelling.     Cervical back: Neck supple.  Skin:    General: Skin is warm and dry.     Capillary Refill: Capillary refill takes less than 2 seconds.  Neurological:     Mental Status: She is alert.  Psychiatric:        Mood and Affect: Mood normal.      UC Treatments / Results  Labs (all labs ordered are listed, but only abnormal results are displayed) Labs Reviewed  POCT URINALYSIS DIPSTICK, ED / UC - Abnormal; Notable for the following components:      Result Value   Glucose, UA >=1000 (*)    Bilirubin Urine SMALL (*)    Ketones, ur 80 (*)    All other components within normal limits  CBG MONITORING, ED - Abnormal; Notable for the following components:   Glucose-Capillary 275 (*)    All other components within normal limits  URINE CULTURE  POCT URINALYSIS DIPSTICK, ED / UC    EKG   Radiology No results found.  Procedures Procedures (including critical care time)  Medications Ordered in UC Medications - No data to display  Initial Impression / Assessment and Plan / UC Course  I have reviewed the triage vital signs and the nursing notes.  Pertinent labs & imaging results that were available during my care of the patient were reviewed by me and considered in my medical decision making (see chart for details).     UA with no signs on infection, will send out for culture.  Discussed importance of better diabetes control.  Advised follow up with PCP.  ED precautions dicussed.  Final Clinical Impressions(s) / UC Diagnoses   Final diagnoses:   Increased urinary frequency     Discharge Instructions      Drink plenty of  water Follow up with primary care physician to continue working on better control of diabetes. Return for evaluation if you develop fever, nausea, vomiting.  Will call with lab results.    ED Prescriptions   None    PDMP not reviewed this encounter.   Ward, Tylene Fantasia, PA-C 03/25/22 1816

## 2022-03-26 LAB — URINE CULTURE: Culture: <10000 — AB

## 2022-05-16 ENCOUNTER — Ambulatory Visit (HOSPITAL_COMMUNITY)
Admission: EM | Admit: 2022-05-16 | Discharge: 2022-05-16 | Disposition: A | Discharge status: Home / Self Care | Payer: Medicare Other | Attending: Physician Assistant | Admitting: Physician Assistant

## 2022-05-16 ENCOUNTER — Encounter (HOSPITAL_COMMUNITY): Payer: Self-pay

## 2022-05-16 DIAGNOSIS — H66002 Acute suppurative otitis media without spontaneous rupture of ear drum, left ear: Secondary | ICD-10-CM | POA: Diagnosis not present

## 2022-05-16 DIAGNOSIS — H60502 Unspecified acute noninfective otitis externa, left ear: Secondary | ICD-10-CM

## 2022-05-16 MED ORDER — OFLOXACIN 0.3 % OT SOLN: 5.0000 [drp] | Freq: Two times a day (BID) | OTIC | 0 refills | Status: DC

## 2022-05-16 MED ORDER — AMOXICILLIN-POT CLAVULANATE 875-125 MG PO TABS: 1.0000 | ORAL_TABLET | Freq: Two times a day (BID) | ORAL | 0 refills | Status: DC

## 2022-05-16 NOTE — ED Provider Notes (Signed)
[facility]    CSN: [id number] Arrival date & time: 05/16/22  0801      History   Chief Complaint Chief Complaint  Patient presents with   Otalgia    HPI Melody Crawford is a 46 y.o. female.   Patient presents today with a 1 day history of severe left otalgia.  Reports pain is rated 10 on a 0-10 pain scale, described as sharp, no aggravating or alleviating factors identified.  She denies any recent illness but has had congestion for several weeks which she attributed to allergies.  She has been taking Sudafed and antihistamines as needed.  Has not tried additional over-the-counter medication.  Does report that she uses Q-tips and believes that she might have injured herself as she had blood after using this a few days ago.  Since then her pain has worsened and she is having difficulty hearing.  She denies any recent airplane travel or swimming.  She denies any recent antibiotic use.  She does have a history of diabetes with associated hyperglycemia; last A1c obtained in August 2023 11.9%    Past Medical History:  Diagnosis Date   Chronic back pain    Diabetes mellitus without complication (HCC)    Hyperlipidemia    Schizophrenia (HCC)     There are no problems to display for this patient.   Past Surgical History:  Procedure Laterality Date   BACK SURGERY      OB History   No obstetric history on file.      Home Medications    Prior to Admission medications   Medication Sig Start Date End Date Taking? Authorizing Provider  amoxicillin-clavulanate (AUGMENTIN) 875-125 MG tablet Take 1 tablet by mouth every 12 (twelve) hours. 05/16/22  Yes Reo Portela K, PA-C  ofloxacin (FLOXIN) 0.3 % OTIC solution Place 5 drops into the left ear 2 (two) times daily. 05/16/22  Yes Ryder Man K, PA-C  atorvastatin (LIPITOR) 20 MG tablet Take 1 mg by mouth daily. 01/07/20 01/06/21  [provider]  GLIPIZIDE PO Take 2 tablets by mouth.    [provider]   INVEGA SUSTENNA 156 MG/ML SUSY injection Inject 156 mg into the muscle as directed. 03/01/20   [provider]  methocarbamol (ROBAXIN) 500 MG tablet Take 1 tablet (500 mg total) by mouth every 8 (eight) hours as needed for muscle spasms. 06/09/21   Layal Javid, Noberto Retort, PA-C  phenazopyridine (PYRIDIUM) 200 MG tablet Take 1 tablet (200 mg total) by mouth 3 (three) times daily as needed for pain. 03/05/20   Wallis Bamberg, PA-C  SITagliptin Phosphate (JANUVIA PO) Take by mouth.    [provider]    Family History Family History  Problem Relation Age of Onset   Kidney disease Father     Social History Social History   Tobacco Use   Smoking status: Every Day    Packs/day: 1.00    Types: Cigarettes   Smokeless tobacco: Never  Vaping Use   Vaping Use: Never used  Substance Use Topics   Alcohol use: Not Currently   Drug use: Never     Allergies   Patient has no known allergies.   Review of Systems Review of Systems  Constitutional:  Positive for activity change. Negative for appetite change, fatigue and fever.  HENT:  Positive for congestion, ear pain and hearing loss. Negative for sinus pressure, sneezing and sore throat.   Respiratory:  Negative for cough and shortness of breath.   Cardiovascular:  Negative  for chest pain.  Gastrointestinal:  Negative for abdominal pain, diarrhea, nausea and vomiting.  Neurological:  Negative for dizziness, light-headedness and headaches.     Physical Exam Triage Vital Signs ED Triage Vitals  Enc Vitals Group     BP 05/16/22 0817 106/74     Pulse Rate 05/16/22 0817 77     Resp 05/16/22 0817 12     Temp 05/16/22 0817 97.6 F (36.4 C)     Temp Source 05/16/22 0817 Oral     SpO2 05/16/22 0817 98 %     Weight --      Height --      Head Circumference --      Peak Flow --      Pain Score 05/16/22 0814 10     Pain Loc --      Pain Edu? --      Excl. in [place]? --    No data found.  Updated Vital Signs BP 106/74 (BP  Location: Left Arm)   Pulse 77   Temp 97.6 F (36.4 C) (Oral)   Resp 12   SpO2 98%   Visual Acuity Right Eye Distance:   Left Eye Distance:   Bilateral Distance:    Right Eye Near:   Left Eye Near:    Bilateral Near:     Physical Exam Vitals reviewed.  Constitutional:      General: She is awake. She is not in acute distress.    Appearance: Normal appearance. She is well-developed. She is not ill-appearing.     Comments: Very pleasant female appears stated age in no acute distress  HENT:     Head: Normocephalic and atraumatic.     Right Ear: Tympanic membrane, ear canal and external ear normal. Tympanic membrane is not erythematous or bulging.     Left Ear: Ear canal and external ear normal. Swelling and tenderness present. Tympanic membrane is erythematous. Tympanic membrane is not bulging.     Ears:     Comments: Significant swelling and drainage noted left external ear canal.  Tenderness palpation with manipulation of external ear.  Erythematous TM; able to visualize approximately 40% without obvious perforation.    Nose:     Right Sinus: Maxillary sinus tenderness present. No frontal sinus tenderness.     Left Sinus: Maxillary sinus tenderness and frontal sinus tenderness present.     Mouth/Throat:     Pharynx: Uvula midline. No oropharyngeal exudate or posterior oropharyngeal erythema.  Cardiovascular:     Rate and Rhythm: Normal rate and regular rhythm.     Heart sounds: Normal heart sounds, S1 normal and S2 normal. No murmur heard. Pulmonary:     Effort: Pulmonary effort is normal.     Breath sounds: Normal breath sounds. No wheezing, rhonchi or rales.     Comments: Clear to auscultation bilaterally Psychiatric:        Behavior: Behavior is cooperative.      UC Treatments / Results  Labs (all labs ordered are listed, but only abnormal results are displayed) Labs Reviewed - No data to display  EKG   Radiology No results found.  Procedures Procedures  (including critical care time)  Medications Ordered in UC Medications - No data to display  Initial Impression / Assessment and Plan / UC Course  I have reviewed the triage vital signs and the nursing notes.  Pertinent labs & imaging results that were available during my care of the patient were reviewed by me and considered in my  medical decision making (see chart for details).     Otitis media and externa noted on exam.  Discussed that patient should avoid putting anything in her ear including Q-tips.  We will treat with fluoroquinolone given history of uncontrolled diabetes for otitis externa.  Ofloxacin sent to pharmacy and she was instructed to keep her ear facing upward several minutes after applying medication to allow complete penetration of your canal.  She was also started on Augmentin given otitis media.  Discussed that she can use over-the-counter medication including Tylenol and ibuprofen for symptom relief.  She is to continue allergy medication and recommended that she use a daily antihistamine as well as Flonase for symptom relief.  She is to rest and drink plenty of fluid.  Discussed that if her symptoms are not improving quickly she should follow-up with ENT and was given contact information for local provider with instruction to call to schedule an appointment.  Strict return precautions given.  Final Clinical Impressions(s) / UC Diagnoses   Final diagnoses:  Non-recurrent acute suppurative otitis media of left ear without spontaneous rupture of tympanic membrane  Acute otitis externa of left ear, unspecified type     Discharge Instructions      Start Augmentin twice daily for 7 days.  It is important that he continue your allergy medication including a daily antihistamine as well as Flonase which is a nasal spray available over-the-counter.  Use ofloxacin drops twice daily for 10 days.  Keep ear facing up for several minutes after applying drops to allow the medication to  get all the way into the ear canal.  Do not submerge her head underwater or put anything in your ears until symptoms resolve.  If your symptoms or not improving quickly please follow-up with ear nose and throat; call to schedule an appointment.  If you have any worsening symptoms you need to be seen immediately.     ED Prescriptions     Medication Sig Dispense Auth. Provider   amoxicillin-clavulanate (AUGMENTIN) 875-125 MG tablet Take 1 tablet by mouth every 12 (twelve) hours. 14 tablet Julya Alioto K, PA-C   ofloxacin (FLOXIN) 0.3 % OTIC solution Place 5 drops into the left ear 2 (two) times daily. 10 mL Jacqulin Brandenburger K, PA-C      PDMP not reviewed this encounter.   Terrilee Croak, PA-C 05/16/22 [id number]

## 2022-05-16 NOTE — Discharge Instructions (Signed)
Start Augmentin twice daily for 7 days.  It is important that he continue your allergy medication including a daily antihistamine as well as Flonase which is a nasal spray available over-the-counter.  Use ofloxacin drops twice daily for 10 days.  Keep ear facing up for several minutes after applying drops to allow the medication to get all the way into the ear canal.  Do not submerge her head underwater or put anything in your ears until symptoms resolve.  If your symptoms or not improving quickly please follow-up with ear nose and throat; call to schedule an appointment.  If you have any worsening symptoms you need to be seen immediately.

## 2022-05-16 NOTE — ED Triage Notes (Signed)
Pt is here for left ear pain since yesterday

## 2022-08-04 ENCOUNTER — Ambulatory Visit
Admission: EM | Admit: 2022-08-04 | Discharge: 2022-08-04 | Disposition: A | Discharge status: Home / Self Care | Payer: 59 | Attending: Emergency Medicine | Admitting: Emergency Medicine

## 2022-08-04 DIAGNOSIS — H1032 Unspecified acute conjunctivitis, left eye: Secondary | ICD-10-CM | POA: Diagnosis not present

## 2022-08-04 MED ORDER — POLYMYXIN B-TRIMETHOPRIM 10000-0.1 UNIT/ML-% OP SOLN: 1.0000 [drp] | Freq: Four times a day (QID) | OPHTHALMIC | 0 refills | Status: AC

## 2022-08-04 NOTE — Discharge Instructions (Addendum)
Use the antibiotic eyedrops as prescribed.  Follow up with your primary care provider if your symptoms are not improving.   ? ? ?

## 2022-08-04 NOTE — ED Triage Notes (Signed)
Patient to Urgent Care with complaints of left eye drainage/ redness. Sensitive to light. Continuously watering.   Symptoms started on Friday. Reports she was exposed to conjunctivitis.

## 2022-08-04 NOTE — ED Provider Notes (Signed)
Melody Crawford    CSN: [id number] Arrival date & time: 08/04/22  4098      History   Chief Complaint Chief Complaint  Patient presents with   Eye Drainage    HPI Melody Crawford is a 47 y.o. female.  Patient presents with 2 day history of left eye redness, itching, and drainage.  She denies eye injury, change in vision, eye pain, fever, chills, ear pain, sore throat, cough, or other symptoms.  No treatment at home.  Her medical history includes diabetes, hyperlipidemia, schizophrenia, chronic back pain.    The history is provided by the patient and medical records.    Past Medical History:  Diagnosis Date   Chronic back pain    Diabetes mellitus without complication (HCC)    Hyperlipidemia    Schizophrenia (HCC)     There are no problems to display for this patient.   Past Surgical History:  Procedure Laterality Date   BACK SURGERY      OB History   No obstetric history on file.      Home Medications    Prior to Admission medications   Medication Sig Start Date End Date Taking? Authorizing Provider  trimethoprim-polymyxin b (POLYTRIM) ophthalmic solution Place 1 drop into both eyes 4 (four) times daily for 7 days. 08/04/22 08/11/22 Yes Mickie Bail, NP  atorvastatin (LIPITOR) 20 MG tablet Take 1 mg by mouth daily. 01/07/20 01/06/21  [provider]  GLIPIZIDE PO Take 2 tablets by mouth.    [provider]  INVEGA SUSTENNA 156 MG/ML SUSY injection Inject 156 mg into the muscle as directed. 03/01/20   [provider]  methocarbamol (ROBAXIN) 500 MG tablet Take 1 tablet (500 mg total) by mouth every 8 (eight) hours as needed for muscle spasms. 06/09/21   Raspet, Noberto Retort, PA-C  phenazopyridine (PYRIDIUM) 200 MG tablet Take 1 tablet (200 mg total) by mouth 3 (three) times daily as needed for pain. 03/05/20   Wallis Bamberg, PA-C  SITagliptin Phosphate (JANUVIA PO) Take by mouth.    [provider]    Family History Family History   Problem Relation Age of Onset   Kidney disease Father     Social History Social History   Tobacco Use   Smoking status: Former    Packs/day: 1.00    Types: Cigarettes   Smokeless tobacco: Never  Vaping Use   Vaping Use: Never used  Substance Use Topics   Alcohol use: Not Currently   Drug use: Never     Allergies   Patient has no known allergies.   Review of Systems Review of Systems  Constitutional:  Negative for chills and fever.  HENT:  Negative for ear pain and sore throat.   Eyes:  Positive for discharge, redness and itching. Negative for pain and visual disturbance.  Respiratory:  Negative for cough and shortness of breath.   All other systems reviewed and are negative.    Physical Exam Triage Vital Signs ED Triage Vitals  Enc Vitals Group     BP 08/04/22 0919 105/73     Pulse Rate 08/04/22 0908 91     Resp 08/04/22 0908 18     Temp 08/04/22 0908 98 F (36.7 C)     Temp src --      SpO2 08/04/22 0908 98 %     Weight 08/04/22 0918 187 lb (84.8 kg)     Height 08/04/22 0918 5\' 4"  (1.626 m)     Head Circumference --  Peak Flow --      Pain Score 08/04/22 0917 10     Pain Loc --      Pain Edu? --      Excl. in [place]? --    No data found.  Updated Vital Signs BP 105/73   Pulse 91   Temp 98 F (36.7 C)   Resp 18   Ht 5\' 4"  (1.626 m)   Wt 187 lb (84.8 kg)   SpO2 98%   BMI 32.10 kg/m   Visual Acuity Right Eye Distance:   Left Eye Distance:   Bilateral Distance:    Right Eye Near:   Left Eye Near:    Bilateral Near:     Physical Exam Vitals and nursing note reviewed.  Constitutional:      General: She is not in acute distress.    Appearance: Normal appearance. She is well-developed. She is not ill-appearing.  HENT:     Right Ear: Tympanic membrane normal.     Left Ear: Tympanic membrane normal.     Nose: Nose normal.     Mouth/Throat:     Mouth: Mucous membranes are moist.     Pharynx: Oropharynx is clear.  Eyes:     General:  Lids are normal. Vision grossly intact.     Extraocular Movements: Extraocular movements intact.     Conjunctiva/sclera:     Left eye: Left conjunctiva is injected.     Pupils: Pupils are equal, round, and reactive to light.  Cardiovascular:     Rate and Rhythm: Normal rate and regular rhythm.     Heart sounds: Normal heart sounds.  Pulmonary:     Effort: Pulmonary effort is normal. No respiratory distress.     Breath sounds: Normal breath sounds.  Musculoskeletal:     Cervical back: Neck supple.  Skin:    General: Skin is warm and dry.  Neurological:     Mental Status: She is alert.  Psychiatric:        Mood and Affect: Mood normal.        Behavior: Behavior normal.      UC Treatments / Results  Labs (all labs ordered are listed, but only abnormal results are displayed) Labs Reviewed - No data to display  EKG   Radiology No results found.  Procedures Procedures (including critical care time)  Medications Ordered in UC Medications - No data to display  Initial Impression / Assessment and Plan / UC Course  I have reviewed the triage vital signs and the nursing notes.  Pertinent labs & imaging results that were available during my care of the patient were reviewed by me and considered in my medical decision making (see chart for details).    Acute bacterial conjunctivitis of left eye.  Treating with Polytrim eyedrops.  Education provided on conjunctivitis.  Instructed patient to follow-up with her PCP or eye care provider if her symptoms are not improving.  Patient agrees to plan of care.   Final Clinical Impressions(s) / UC Diagnoses   Final diagnoses:  Acute bacterial conjunctivitis of left eye     Discharge Instructions      Use the antibiotic eyedrops as prescribed.    Follow-up with your primary care provider if your symptoms are not improving.        ED Prescriptions     Medication Sig Dispense Auth. Provider   trimethoprim-polymyxin b  (POLYTRIM) ophthalmic solution Place 1 drop into both eyes 4 (four) times daily for 7 days.  10 mL Sharion Balloon, NP      PDMP not reviewed this encounter.   Sharion Balloon, NP 08/04/22 [phone]

## 2022-10-13 ENCOUNTER — Encounter (HOSPITAL_COMMUNITY): Payer: Self-pay

## 2022-10-13 ENCOUNTER — Ambulatory Visit (HOSPITAL_COMMUNITY)
Admission: EM | Admit: 2022-10-13 | Discharge: 2022-10-13 | Disposition: A | Discharge status: Home / Self Care | Payer: 59 | Attending: Nurse Practitioner | Admitting: Nurse Practitioner

## 2022-10-13 DIAGNOSIS — N760 Acute vaginitis: Secondary | ICD-10-CM

## 2022-10-13 LAB — HIV ANTIBODY (ROUTINE TESTING W REFLEX): HIV Screen 4th Generation wRfx: NONREACTIVE

## 2022-10-13 MED ORDER — FLUCONAZOLE 150 MG PO TABS: 150.0000 mg | ORAL_TABLET | Freq: Every day | ORAL | 0 refills | Status: DC

## 2022-10-13 NOTE — ED Triage Notes (Signed)
Patient here for vaginal itching and burning since after having intercourse on 08/27/2022. She has not been sexually active since. Symptoms started the day after. She states that she thinks that she has some vaginal swelling as well. She is also diabetic. She has used Vaseline but not helping.

## 2022-10-13 NOTE — ED Provider Notes (Signed)
[facility]    CSN: [id number] Arrival date & time: 10/13/22  1353      History   Chief Complaint Chief Complaint  Patient presents with   Vaginal Itching    HPI Melody Crawford is a 47 y.o. female.   Patient presents today for 6-week history of vaginal itching and irritation.  Reports symptoms began after last sexual encounter which was approximately 08/27/2022 and did not use protection.  She reports she has a thin, milky, white discharge as well.  No clumpy discharge.  She feels her labia are swollen and also burns when she urinates.  No open sores, rashes, or lesions that she knows of.  No groin lymphadenopathy, abdominal pain, nausea/vomiting.  Reports a history of diabetes and her blood sugars have been elevated since this began.  No urinary frequency, urgency, or urinary odor.  No pelvic pain or vaginal bleeding.  Reports she had hysterectomy years ago.    Past Medical History:  Diagnosis Date   Chronic back pain    Diabetes mellitus without complication ([place])    Hyperlipidemia    Schizophrenia ([place])     There are no problems to display for this patient.   Past Surgical History:  Procedure Laterality Date   BACK SURGERY      OB History   No obstetric history on file.      Home Medications    Prior to Admission medications   Medication Sig Start Date End Date Taking? Authorizing Provider  fluconazole (DIFLUCAN) 150 MG tablet Take 1 tablet (150 mg total) by mouth daily. 10/13/22  Yes Noemi Chapel A, NP  GLIPIZIDE PO Take 2 tablets by mouth.   Yes [provider]  INVEGA SUSTENNA 156 MG/ML SUSY injection Inject 156 mg into the muscle as directed. 03/01/20  Yes [provider]  methocarbamol (ROBAXIN) 500 MG tablet Take 1 tablet (500 mg total) by mouth every 8 (eight) hours as needed for muscle spasms. 06/09/21  Yes Raspet, Erin K, PA-C  phenazopyridine (PYRIDIUM) 200 MG tablet Take 1 tablet (200 mg total) by mouth 3 (three) times  daily as needed for pain. 03/05/20  Yes Jaynee Eagles, PA-C  SITagliptin Phosphate (JANUVIA PO) Take by mouth.   Yes [provider]  atorvastatin (LIPITOR) 20 MG tablet Take 1 mg by mouth daily. 01/07/20 01/06/21  [provider]    Family History Family History  Problem Relation Age of Onset   Kidney disease Father     Social History Social History   Tobacco Use   Smoking status: Former    Packs/day: 1    Types: Cigarettes   Smokeless tobacco: Never  Vaping Use   Vaping Use: Never used  Substance Use Topics   Alcohol use: Not Currently   Drug use: Never     Allergies   Patient has no known allergies.   Review of Systems Review of Systems Per HPI  Physical Exam Triage Vital Signs ED Triage Vitals  Enc Vitals Group     BP 10/13/22 1513 111/77     Pulse Rate 10/13/22 1513 90     Resp 10/13/22 1513 16     Temp 10/13/22 1513 98.6 F (37 C)     Temp Source 10/13/22 1513 Oral     SpO2 10/13/22 1513 96 %     Weight 10/13/22 1512 187 lb (84.8 kg)     Height 10/13/22 1512 5\' 4"  (1.626 m)     Head Circumference --  Peak Flow --      Pain Score 10/13/22 1512 10     Pain Loc --      Pain Edu? --      Excl. in [place]? --    No data found.  Updated Vital Signs BP 111/77 (BP Location: Left Arm)   Pulse 90   Temp 98.6 F (37 C) (Oral)   Resp 16   Ht 5\' 4"  (1.626 m)   Wt 187 lb (84.8 kg)   SpO2 96%   BMI 32.10 kg/m   Visual Acuity Right Eye Distance:   Left Eye Distance:   Bilateral Distance:    Right Eye Near:   Left Eye Near:    Bilateral Near:     Physical Exam Vitals and nursing note reviewed. Exam conducted with a chaperone present Janace Hoard, CMA).  Constitutional:      General: She is not in acute distress.    Appearance: Normal appearance. She is not toxic-appearing.  Pulmonary:     Effort: Pulmonary effort is normal. No respiratory distress.  Genitourinary:    Exam position: Lithotomy position.     Pubic Area: No rash  or pubic lice.      Labia:        Right: Tenderness present. No rash, lesion or injury.        Left: Tenderness present. No rash, lesion or injury.      Vagina: Vaginal discharge present. No erythema or bleeding.     Comments: Thick, white, clumpy discharge noted to external labia; no labial swelling, open sores or rashes.  The external labia appear excoriated and macerated. Lymphadenopathy:     Lower Body: No right inguinal adenopathy. No left inguinal adenopathy.  Skin:    General: Skin is warm and dry.     Coloration: Skin is not jaundiced or pale.     Findings: No erythema.  Neurological:     Mental Status: She is alert and oriented to person, place, and time.     Motor: No weakness.     Gait: Gait normal.  Psychiatric:        Behavior: Behavior is cooperative.      UC Treatments / Results  Labs (all labs ordered are listed, but only abnormal results are displayed) Labs Reviewed  HIV ANTIBODY (ROUTINE TESTING W REFLEX)  RPR  CERVICOVAGINAL ANCILLARY ONLY    EKG   Radiology No results found.  Procedures Procedures (including critical care time)  Medications Ordered in UC Medications - No data to display  Initial Impression / Assessment and Plan / UC Course  I have reviewed the triage vital signs and the nursing notes.  Pertinent labs & imaging results that were available during my care of the patient were reviewed by me and considered in my medical decision making (see chart for details).   Patient is well-appearing, normotensive, afebrile, not tachycardic, not tachypneic, oxygenating well on room air.    1. Acute vaginitis Suspect yeast vaginitis Diflucan sent to pharmacy-encouraged her to take this and we will call with any other positive results vaginal swab is pending HIV and syphilis testing is also pending Treat as indicated Recommended condom use with every sexual encounter  The patient was given the opportunity to ask questions.  All questions  answered to their satisfaction.  The patient is in agreement to this plan.    Final Clinical Impressions(s) / UC Diagnoses   Final diagnoses:  Acute vaginitis     Discharge Instructions  As we discussed, I believe you have a yeast infection.  Please take the Diflucan as prescribed to treat it.  We have also tested you for bacterial vaginosis, chlamydia, gonorrhea, trichomonas, HIV, and syphilis.  Will call you with any positive results later this week.  Please use condoms with every sexual, to prevent STI.    ED Prescriptions     Medication Sig Dispense Auth. Provider   fluconazole (DIFLUCAN) 150 MG tablet Take 1 tablet (150 mg total) by mouth daily. 1 tablet Eulogio Bear, NP      PDMP not reviewed this encounter.   Eulogio Bear, NP 10/13/22 [phone]

## 2022-10-13 NOTE — Discharge Instructions (Signed)
As we discussed, I believe you have a yeast infection.  Please take the Diflucan as prescribed to treat it.  We have also tested you for bacterial vaginosis, chlamydia, gonorrhea, trichomonas, HIV, and syphilis.  Will call you with any positive results later this week.  Please use condoms with every sexual, to prevent STI.

## 2022-10-14 LAB — CERVICOVAGINAL ANCILLARY ONLY
Bacterial Vaginitis (gardnerella): NEGATIVE
Candida Glabrata: POSITIVE — AB
Candida Vaginitis: POSITIVE — AB
Chlamydia: NEGATIVE
Comment: NEGATIVE
Comment: NEGATIVE
Comment: NEGATIVE
Comment: NEGATIVE
Comment: NEGATIVE
Comment: NORMAL
Neisseria Gonorrhea: NEGATIVE
Trichomonas: NEGATIVE

## 2022-10-14 LAB — RPR: RPR Ser Ql: NONREACTIVE

## 2022-10-16 ENCOUNTER — Encounter (HOSPITAL_COMMUNITY): Payer: Self-pay

## 2022-10-16 ENCOUNTER — Ambulatory Visit (HOSPITAL_COMMUNITY)
Admission: EM | Admit: 2022-10-16 | Discharge: 2022-10-16 | Disposition: A | Discharge status: Home / Self Care | Payer: 59 | Attending: Emergency Medicine | Admitting: Emergency Medicine

## 2022-10-16 DIAGNOSIS — H10021 Other mucopurulent conjunctivitis, right eye: Secondary | ICD-10-CM

## 2022-10-16 MED ORDER — OLOPATADINE HCL 0.1 % OP SOLN: 1.0000 [drp] | Freq: Every day | OPHTHALMIC | 12 refills | Status: DC

## 2022-10-16 MED ORDER — POLYMYXIN B-TRIMETHOPRIM 10000-0.1 UNIT/ML-% OP SOLN: 1.0000 [drp] | OPHTHALMIC | 0 refills | Status: AC

## 2022-10-16 NOTE — ED Triage Notes (Signed)
Pt is here for right eye red, drainage swollen , itchy and irritable . Pt has tried warm compress to the eye.

## 2022-10-16 NOTE — ED Provider Notes (Signed)
[facility]    CSN: [id number] Arrival date & time: 10/16/22  0910      History   Chief Complaint Chief Complaint  Patient presents with   Eye Drainage    HPI Melody Crawford is a 47 y.o. female.   Patient presents to clinic for right eye redness, watery drainage, itchiness and photosensitivity for the past 2 days.  She has tried a warm compress without relief.  She does not wear contacts or glasses.   Denies eye pain, changes in vision, or recent illness.  The history is provided by the patient and medical records.    Past Medical History:  Diagnosis Date   Chronic back pain    Diabetes mellitus without complication ([place])    Hyperlipidemia    Schizophrenia ([place])     There are no problems to display for this patient.   Past Surgical History:  Procedure Laterality Date   BACK SURGERY      OB History   No obstetric history on file.      Home Medications    Prior to Admission medications   Medication Sig Start Date End Date Taking? Authorizing Provider  GLIPIZIDE PO Take 2 tablets by mouth.   Yes [provider]  methocarbamol (ROBAXIN) 500 MG tablet Take 1 tablet (500 mg total) by mouth every 8 (eight) hours as needed for muscle spasms. 06/09/21  Yes Raspet, Erin K, PA-C  olopatadine (PATADAY) 0.1 % ophthalmic solution Place 1 drop into the right eye daily. 10/16/22  Yes Louretta Shorten, [place] N, FNP  trimethoprim-polymyxin b (POLYTRIM) ophthalmic solution Place 1 drop into the right eye every 4 (four) hours for 5 days. 10/16/22 10/21/22 Yes Louretta Shorten, [place] N, FNP  atorvastatin (LIPITOR) 20 MG tablet Take 1 mg by mouth daily. 01/07/20 01/06/21  [provider]  INVEGA SUSTENNA 156 MG/ML SUSY injection Inject 156 mg into the muscle as directed. 03/01/20   [provider]  SITagliptin Phosphate (JANUVIA PO) Take by mouth.    [provider]    Family History Family History  Problem Relation Age of Onset   Kidney disease  Father     Social History Social History   Tobacco Use   Smoking status: Former    Packs/day: 1    Types: Cigarettes   Smokeless tobacco: Never  Vaping Use   Vaping Use: Never used  Substance Use Topics   Alcohol use: Not Currently   Drug use: Never     Allergies   Patient has no known allergies.   Review of Systems Review of Systems  Constitutional:  Negative for chills and fatigue.  HENT:  Negative for congestion, postnasal drip, rhinorrhea and sore throat.   Eyes:  Positive for photophobia, discharge, redness and itching. Negative for pain and visual disturbance.  Respiratory:  Negative for cough and shortness of breath.   Cardiovascular:  Negative for chest pain.     Physical Exam Triage Vital Signs ED Triage Vitals  Enc Vitals Group     BP 10/16/22 0932 99/74     Pulse Rate 10/16/22 0932 68     Resp 10/16/22 0932 12     Temp 10/16/22 0932 98.3 F (36.8 C)     Temp Source 10/16/22 0932 Oral     SpO2 10/16/22 0932 94 %     Weight --      Height --      Head Circumference --      Peak Flow --  Pain Score 10/16/22 0929 0     Pain Loc --      Pain Edu? --      Excl. in [place]? --    No data found.  Updated Vital Signs BP 99/74 (BP Location: Right Arm)   Pulse 68   Temp 98.3 F (36.8 C) (Oral)   Resp 12   SpO2 94%   Visual Acuity Right Eye Distance: 20/70 (pt did not have her glasses) Left Eye Distance: 20/70 (pt did not have glasses with her) Bilateral Distance: 20/70 (pt did not have glasses with her)  Right Eye Near:   Left Eye Near:    Bilateral Near:     Physical Exam Vitals and nursing note reviewed.  Constitutional:      Appearance: Normal appearance.  HENT:     Head: Normocephalic and atraumatic.     Right Ear: External ear normal.     Left Ear: External ear normal.     Nose: Nose normal.     Mouth/Throat:     Mouth: Mucous membranes are moist.  Eyes:     General: Lids are normal. No scleral icterus.       Right eye:  Discharge present. No hordeolum.        Left eye: No discharge or hordeolum.     Extraocular Movements: Extraocular movements intact.     Right eye: Normal extraocular motion.     Left eye: Normal extraocular motion.     Conjunctiva/sclera:     Right eye: Right conjunctiva is injected.     Pupils: Pupils are equal, round, and reactive to light.  Cardiovascular:     Rate and Rhythm: Normal rate and regular rhythm.  Pulmonary:     Effort: Pulmonary effort is normal. No respiratory distress.  Musculoskeletal:     Cervical back: Normal range of motion.  Skin:    Capillary Refill: Capillary refill takes less than 2 seconds.  Neurological:     Mental Status: She is alert and oriented to person, place, and time.  Psychiatric:        Mood and Affect: Mood normal.        Behavior: Behavior is cooperative.      UC Treatments / Results  Labs (all labs ordered are listed, but only abnormal results are displayed) Labs Reviewed - No data to display  EKG   Radiology No results found.  Procedures Procedures (including critical care time)  Medications Ordered in UC Medications - No data to display  Initial Impression / Assessment and Plan / UC Course  I have reviewed the triage vital signs and the nursing notes.  Pertinent labs & imaging results that were available during my care of the patient were reviewed by me and considered in my medical decision making (see chart for details).  Vitals in triage reviewed, patient is hemodynamically stable.  Suspect bacterial versus allergic conjunctivitis with conjunctival injection, discharge and gritty sensation for the past 2 days to her right eye.  Without red flag symptoms of vision changes, loss of vision, eye pain, fever or other concerning symptoms.  Will cover with Polytrim for bacterial and educated to continue on Pataday to help with allergic symptoms. Plan of care, follow up care and return precautions discussed, patient verbalized  understanding no questions at this time.     Final Clinical Impressions(s) / UC Diagnoses   Final diagnoses:  Other mucopurulent conjunctivitis of right eye     Discharge Instructions  I am covering you for both allergic and bacterial conjunctivitis.  Please use the eyedrops as prescribed.  You can do the Polytrim every 4 hours for the next 5 days.  Please do the Pataday 1 drop into the right eye daily, separate the bacterial and allergic eyedrop administration by at least 2 hours.   Please return to clinic or your primary care if you have no improvement over the next few days.  Please seek immediate care if you develop loss of vision or eye pain.      ED Prescriptions     Medication Sig Dispense Auth. Provider   olopatadine (PATADAY) 0.1 % ophthalmic solution Place 1 drop into the right eye daily. 5 mL Louretta Shorten, [place] N, [place]   trimethoprim-polymyxin b (POLYTRIM) ophthalmic solution Place 1 drop into the right eye every 4 (four) hours for 5 days. 10 mL Lilyann Gravelle, [place] N, FNP      PDMP not reviewed this encounter.   Louretta Shorten [place] N, [place] 10/16/22 [phone]

## 2022-10-16 NOTE — Discharge Instructions (Addendum)
I am covering you for both allergic and bacterial conjunctivitis.  Please use the eyedrops as prescribed.  You can do the Polytrim every 4 hours for the next 5 days.  Please do the Pataday 1 drop into the right eye daily, separate the bacterial and allergic eyedrop administration by at least 2 hours.   Please return to clinic or your primary care if you have no improvement over the next few days.  Please seek immediate care if you develop loss of vision or eye pain.

## 2023-01-01 DIAGNOSIS — F251 Schizoaffective disorder, depressive type: Secondary | ICD-10-CM | POA: Diagnosis not present

## 2023-02-24 ENCOUNTER — Encounter (HOSPITAL_COMMUNITY): Payer: Self-pay

## 2023-02-24 ENCOUNTER — Ambulatory Visit (HOSPITAL_COMMUNITY)
Admission: EM | Admit: 2023-02-24 | Discharge: 2023-02-24 | Disposition: A | Discharge status: Home / Self Care | Payer: 59 | Attending: Family Medicine | Admitting: Family Medicine

## 2023-02-24 DIAGNOSIS — L02411 Cutaneous abscess of right axilla: Secondary | ICD-10-CM | POA: Diagnosis not present

## 2023-02-24 MED ORDER — CEPHALEXIN 500 MG PO CAPS: 500.0000 mg | ORAL_CAPSULE | Freq: Three times a day (TID) | ORAL | 0 refills | Status: AC

## 2023-02-24 MED ORDER — LIDOCAINE-EPINEPHRINE-TETRACAINE (LET) TOPICAL GEL
3.0000 mL | Freq: Once | TOPICAL | Status: AC
  Administered 2023-02-24: 3 mL via TOPICAL

## 2023-02-24 MED ORDER — LIDOCAINE-EPINEPHRINE-TETRACAINE (LET) TOPICAL GEL
TOPICAL | Status: AC
  Filled 2023-02-24: qty 3

## 2023-02-24 MED ORDER — LIDOCAINE HCL (PF) 1 % IJ SOLN
INTRAMUSCULAR | Status: AC
  Filled 2023-02-24: qty 2

## 2023-02-24 NOTE — ED Provider Notes (Signed)
[facility]    CSN: [id number] Arrival date & time: 02/24/23  0801      History   Chief Complaint Chief Complaint  Patient presents with   Abscess    HPI Melody Crawford is a 47 y.o. female.    Abscess Here for swollen red area in right axilla, started bothering her about 2-3 days ago. No f/c.  NKDA  Past Medical History:  Diagnosis Date   Chronic back pain    Diabetes mellitus without complication ([facility])    Hyperlipidemia    Schizophrenia ([facility])     There are no problems to display for this patient.   Past Surgical History:  Procedure Laterality Date   BACK SURGERY      OB History   No obstetric history on file.      Home Medications    Prior to Admission medications   Medication Sig Start Date End Date Taking? Authorizing Provider  cephALEXin (KEFLEX) 500 MG capsule Take 1 capsule (500 mg total) by mouth 3 (three) times daily for 7 days. 02/24/23 03/03/23 Yes Rexford Prevo, Janace Aris, MD  INVEGA SUSTENNA 156 MG/ML SUSY injection Inject 156 mg into the muscle as directed. 03/01/20  Yes [provider]  Semaglutide (RYBELSUS PO) Take by mouth.   Yes [provider]  atorvastatin (LIPITOR) 20 MG tablet Take 1 mg by mouth daily. 01/07/20 01/06/21  [provider]  GLIPIZIDE PO Take 2 tablets by mouth.    [provider]  methocarbamol (ROBAXIN) 500 MG tablet Take 1 tablet (500 mg total) by mouth every 8 (eight) hours as needed for muscle spasms. 06/09/21   Raspet, Denny Peon K, PA-C  olopatadine (PATADAY) 0.1 % ophthalmic solution Place 1 drop into the right eye daily. 10/16/22   Garrison, [place] N, FNP  SITagliptin Phosphate (JANUVIA PO) Take by mouth.    [provider]    Family History Family History  Problem Relation Age of Onset   Kidney disease Father     Social History Social History   Tobacco Use   Smoking status: Former    Current packs/day: 1.00    Types: Cigarettes   Smokeless tobacco: Never  Vaping  Use   Vaping status: Never Used  Substance Use Topics   Alcohol use: Not Currently   Drug use: Never     Allergies   Patient has no known allergies.   Review of Systems Review of Systems   Physical Exam Triage Vital Signs ED Triage Vitals  Encounter Vitals Group     BP 02/24/23 0814 106/68     Systolic BP Percentile --      Diastolic BP Percentile --      Pulse Rate 02/24/23 0814 86     Resp 02/24/23 0814 16     Temp 02/24/23 0814 98 F (36.7 C)     Temp Source 02/24/23 0814 Oral     SpO2 02/24/23 0814 98 %     Weight 02/24/23 0814 180 lb (81.6 kg)     Height 02/24/23 0814 5\' 3"  (1.6 m)     Head Circumference --      Peak Flow --      Pain Score 02/24/23 0813 10     Pain Loc --      Pain Education --      Exclude from Growth Chart --    No data found.  Updated Vital Signs BP 106/68 (BP Location: Left Arm)   Pulse 86   Temp 98  F (36.7 C) (Oral)   Resp 16   Ht 5\' 3"  (1.6 m)   Wt 81.6 kg   SpO2 98%   BMI 31.89 kg/m   Visual Acuity Right Eye Distance:   Left Eye Distance:   Bilateral Distance:    Right Eye Near:   Left Eye Near:    Bilateral Near:     Physical Exam Vitals reviewed.  Constitutional:      General: She is not in acute distress.    Appearance: She is not toxic-appearing.  Skin:    Coloration: Skin is not pale.     Comments: There is an area of erythema and induration and tenderness that is about 2 x 1 cm in her right axilla.  There is fluctuance in the medial aspect of that area  Neurological:     Mental Status: She is alert and oriented to person, place, and time.  Psychiatric:        Behavior: Behavior normal.      UC Treatments / Results  Labs (all labs ordered are listed, but only abnormal results are displayed) Labs Reviewed - No data to display  EKG   Radiology No results found.  Procedures Procedures (including critical care time)  Medications Ordered in UC Medications  lidocaine-EPINEPHrine-tetracaine (LET)  topical gel (3 mLs Topical Given 02/24/23 0833)    Initial Impression / Assessment and Plan / UC Course  I have reviewed the triage vital signs and the nursing notes.  Pertinent labs & imaging results that were available during my care of the patient were reviewed by me and considered in my medical decision making (see chart for details).        Some benefits of I&D were discussed and verbal consent given.  Let was first applied to the abscess for 30 minutes.  Then 1% lidocaine plain was used to inject in the roof of the abscess.  Under clean conditions #11 blade was used to get a stab wound in the roof of the abscess.  3 mL of purulent material was obtained.  EBL 0.  No complications.   Final Clinical Impressions(s) / UC Diagnoses   Final diagnoses:  Abscess of axilla, right     Discharge Instructions      Take cephalexin 500 mg--1 capsule 3 times daily for 7 days  Wash the area with soapy water 2 times daily.  Warm compress to the area 2 times daily can help circulation and help healing       ED Prescriptions     Medication Sig Dispense Auth. Provider   cephALEXin (KEFLEX) 500 MG capsule Take 1 capsule (500 mg total) by mouth 3 (three) times daily for 7 days. 21 capsule Zenia Resides, MD      PDMP not reviewed this encounter.   Zenia Resides, MD 02/24/23 [phone]

## 2023-02-24 NOTE — Discharge Instructions (Signed)
Take cephalexin 500 mg--1 capsule 3 times daily for 7 days  Wash the area with soapy water 2 times daily.  Warm compress to the area 2 times daily can help circulation and help healing

## 2023-02-24 NOTE — ED Triage Notes (Signed)
Patient here today with c/o abscess on right underarm X 3 days.

## 2023-04-24 ENCOUNTER — Ambulatory Visit (HOSPITAL_COMMUNITY)
Admission: EM | Admit: 2023-04-24 | Discharge: 2023-04-24 | Disposition: A | Discharge status: Home / Self Care | Payer: 59 | Attending: Family Medicine | Admitting: Family Medicine

## 2023-04-24 ENCOUNTER — Encounter (HOSPITAL_COMMUNITY): Payer: Self-pay

## 2023-04-24 DIAGNOSIS — B354 Tinea corporis: Secondary | ICD-10-CM | POA: Diagnosis not present

## 2023-04-24 DIAGNOSIS — R21 Rash and other nonspecific skin eruption: Secondary | ICD-10-CM

## 2023-04-24 MED ORDER — FLUCONAZOLE 100 MG PO TABS: ORAL_TABLET | ORAL | 0 refills | Status: DC

## 2023-04-24 MED ORDER — TRIAMCINOLONE ACETONIDE 0.1 % EX CREA: 1.0000 | TOPICAL_CREAM | Freq: Two times a day (BID) | CUTANEOUS | 0 refills | Status: DC

## 2023-04-24 NOTE — ED Triage Notes (Signed)
Pt is here for a rash around her neck x 1 week.

## 2023-04-24 NOTE — Discharge Instructions (Signed)
Fluconazole 100 mg--2 tablets by mouth the first day then 1 tablet by mouth daily for 6 more days.  Triamcinolone cream--apply 2 times daily to the rash area until better, about 10 to 14 days. Follow-up with your primary care about this issue

## 2023-04-24 NOTE — ED Provider Notes (Signed)
[facility]    CSN: [id number] Arrival date & time: 04/24/23  0802      History   Chief Complaint Chief Complaint  Patient presents with   Rash    HPI Melody Crawford is a 47 y.o. female.    Rash Here for rash that has been going on for several weeks.  It is on her neck and upper chest and on her right chest under her right breast.  It is itchy some.  No drainage or fever.    Past Medical History:  Diagnosis Date   Chronic back pain    Diabetes mellitus without complication ([facility])    Hyperlipidemia    Schizophrenia ([facility])     There are no problems to display for this patient.   Past Surgical History:  Procedure Laterality Date   BACK SURGERY      OB History   No obstetric history on file.      Home Medications    Prior to Admission medications   Medication Sig Start Date End Date Taking? Authorizing Provider  fluconazole (DIFLUCAN) 100 MG tablet 2 tablets by mouth the first day, then 1 tablet daily for 6 more days. 04/24/23  Yes Zenia Resides, MD  GLIPIZIDE PO Take 2 tablets by mouth.   Yes [provider]  INVEGA SUSTENNA 156 MG/ML SUSY injection Inject 156 mg into the muscle as directed. 03/01/20  Yes [provider]  Semaglutide (RYBELSUS PO) Take by mouth.   Yes [provider]  triamcinolone cream (KENALOG) 0.1 % Apply 1 Application topically 2 (two) times daily. To affected area till better 04/24/23  Yes Carmalita Wakefield, Janace Aris, MD  atorvastatin (LIPITOR) 20 MG tablet Take 1 mg by mouth daily. 01/07/20 01/06/21  [provider]    Family History Family History  Problem Relation Age of Onset   Kidney disease Father     Social History Social History   Tobacco Use   Smoking status: Former    Current packs/day: 1.00    Types: Cigarettes   Smokeless tobacco: Never  Vaping Use   Vaping status: Never Used  Substance Use Topics   Alcohol use: Not Currently   Drug use: Never     Allergies   Patient  has no known allergies.   Review of Systems Review of Systems  Skin:  Positive for rash.     Physical Exam Triage Vital Signs ED Triage Vitals [04/24/23 0819]  Encounter Vitals Group     BP 108/73     Systolic BP Percentile      Diastolic BP Percentile      Pulse Rate 83     Resp 16     Temp 98.2 F (36.8 C)     Temp Source Oral     SpO2 98 %     Weight      Height      Head Circumference      Peak Flow      Pain Score      Pain Loc      Pain Education      Exclude from Growth Chart    No data found.  Updated Vital Signs BP 108/73 (BP Location: Left Arm)   Pulse 83   Temp 98.2 F (36.8 C) (Oral)   Resp 16   SpO2 98%   Visual Acuity Right Eye Distance:   Left Eye Distance:   Bilateral Distance:    Right Eye Near:   Left Eye Near:  Bilateral Near:     Physical Exam Vitals reviewed.  Constitutional:      General: She is not in acute distress.    Appearance: She is not ill-appearing, toxic-appearing or diaphoretic.  Lymphadenopathy:     Cervical: No cervical adenopathy.  Skin:    Coloration: Skin is not pale.     Comments: There is a flat mildly erythematous rash that is in spots about 1 to 1.5 cm in diameter.  They are slightly scaly.  They are on her neck at the base and up toward her hairline and up toward her chin.  Similar rash is noted under her right breast.  Neurological:     Mental Status: She is alert and oriented to person, place, and time.  Psychiatric:        Behavior: Behavior normal.      UC Treatments / Results  Labs (all labs ordered are listed, but only abnormal results are displayed) Labs Reviewed - No data to display  EKG   Radiology No results found.  Procedures Procedures (including critical care time)  Medications Ordered in UC Medications - No data to display  Initial Impression / Assessment and Plan / UC Course  I have reviewed the triage vital signs and the nursing notes.  Pertinent labs & imaging results  that were available during my care of the patient were reviewed by me and considered in my medical decision making (see chart for details).      ]  I do think overall this is a fungal rash.  Fluconazole is sent in to treat. I am also sending in triamcinolone to use on the rash for up to 2 weeks.  Final Clinical Impressions(s) / UC Diagnoses   Final diagnoses:  Rash  Tinea corporis     Discharge Instructions      Fluconazole 100 mg--2 tablets by mouth the first day then 1 tablet by mouth daily for 6 more days.  Triamcinolone cream--apply 2 times daily to the rash area until better, about 10 to 14 days. Follow-up with your primary care about this issue     ED Prescriptions     Medication Sig Dispense Auth. Provider   fluconazole (DIFLUCAN) 100 MG tablet 2 tablets by mouth the first day, then 1 tablet daily for 6 more days. 8 tablet Zenia Resides, MD   triamcinolone cream (KENALOG) 0.1 % Apply 1 Application topically 2 (two) times daily. To affected area till better 80 g Marlinda Mike Janace Aris, MD      PDMP not reviewed this encounter.   Zenia Resides, MD 04/24/23 [phone]

## 2023-07-28 ENCOUNTER — Telehealth (INDEPENDENT_AMBULATORY_CARE_PROVIDER_SITE_OTHER): Payer: Self-pay | Admitting: Primary Care

## 2023-07-28 NOTE — Telephone Encounter (Signed)
 Called to remind pt about apt. VM left and if pt call back please advise.

## 2023-07-29 ENCOUNTER — Encounter (INDEPENDENT_AMBULATORY_CARE_PROVIDER_SITE_OTHER): Payer: Self-pay | Admitting: Primary Care

## 2023-07-29 ENCOUNTER — Ambulatory Visit (INDEPENDENT_AMBULATORY_CARE_PROVIDER_SITE_OTHER): Payer: 59 | Admitting: Primary Care

## 2023-07-29 VITALS — BP 114/70 | HR 80 | Resp 16 | Ht 63.0 in | Wt 183.2 lb

## 2023-07-29 DIAGNOSIS — E66811 Obesity, class 1: Secondary | ICD-10-CM

## 2023-07-29 DIAGNOSIS — H6122 Impacted cerumen, left ear: Secondary | ICD-10-CM

## 2023-07-29 DIAGNOSIS — Z1159 Encounter for screening for other viral diseases: Secondary | ICD-10-CM | POA: Diagnosis not present

## 2023-07-29 DIAGNOSIS — Z794 Long term (current) use of insulin: Secondary | ICD-10-CM

## 2023-07-29 DIAGNOSIS — E1165 Type 2 diabetes mellitus with hyperglycemia: Secondary | ICD-10-CM | POA: Diagnosis not present

## 2023-07-29 DIAGNOSIS — Z23 Encounter for immunization: Secondary | ICD-10-CM

## 2023-07-29 DIAGNOSIS — E669 Obesity, unspecified: Secondary | ICD-10-CM | POA: Diagnosis not present

## 2023-07-29 DIAGNOSIS — Z1211 Encounter for screening for malignant neoplasm of colon: Secondary | ICD-10-CM

## 2023-07-29 DIAGNOSIS — Z6832 Body mass index (BMI) 32.0-32.9, adult: Secondary | ICD-10-CM

## 2023-07-29 DIAGNOSIS — Z79899 Other long term (current) drug therapy: Secondary | ICD-10-CM

## 2023-07-29 LAB — POCT GLYCOSYLATED HEMOGLOBIN (HGB A1C): HbA1c, POC (controlled diabetic range): 10.9 % — AB (ref 0.0–7.0)

## 2023-07-29 LAB — GLUCOSE, POCT (MANUAL RESULT ENTRY): POC Glucose: 342 mg/dL — AB (ref 70–99)

## 2023-07-29 MED ORDER — GLIPIZIDE ER 10 MG PO TB24: 10.0000 mg | ORAL_TABLET | Freq: Every day | ORAL | 1 refills | Status: DC

## 2023-07-29 MED ORDER — RYBELSUS 14 MG PO TABS: 14.0000 mg | ORAL_TABLET | Freq: Every day | ORAL | 1 refills | Status: DC

## 2023-07-29 NOTE — Progress Notes (Signed)
 New Patient Office Visit  Subjective    Patient ID: Melody Crawford female  DOB: Apr 11, 1976  Age: 48 y.o. MRN: [id number]   CC:   HPI     Diabetes    Additional comments: A1C-10.9 CBG- 342       Last edited by Casimir Juvenal SAUNDERS, RMA on 07/29/2023  9:12 AM.      Diabetes She presents for her follow-up diabetic visit. She has type 2 diabetes mellitus. No MedicAlert identification noted. The initial diagnosis of diabetes was made 10 years ago. Her disease course has been worsening. Hypoglycemia symptoms include sleepiness. Associated symptoms include blurred vision, foot paresthesias, polydipsia, polyphagia and visual change. There are no hypoglycemic complications. Risk factors for coronary artery disease include diabetes mellitus and obesity. When asked about current treatments, none were reported. She is compliant with treatment none of the time. Her weight is stable. When asked about meal planning, she reported none. She has not had a previous visit with a dietitian. Her home blood glucose trend is increasing rapidly. Her breakfast blood glucose is taken between 8-9 am. Her breakfast blood glucose range is generally >200 mg/dl. Her lunch blood glucose is taken between 1-2 pm. Her lunch blood glucose range is generally >200 mg/dl. Her dinner blood glucose is taken between 5-6 pm. Her dinner blood glucose range is generally >200 mg/dl. Her bedtime blood glucose range is generally >200 mg/dl. Her overall blood glucose range is >200 mg/dl. An ACE inhibitor/angiotensin II receptor blocker is not being taken. She does not see a podiatrist.Eye exam is current (November 2024- [facility]).    Complications from uncontrolled diabetes -diabetic retinopathy leading to blindness, diabetic nephropathy leading to dialysis, decrease in circulation decrease in sores or wound healing which may lead to amputations and increase of heart attack and stroke  Melody Crawford  is a 48 year old obese female in  today to establish care. Question patient why she will take a shot once a month but not take a shot for her uncontrolled diabetes. The shot helps my keeps my mind and not going off on anybody.  Current Outpatient Medications on File Prior to Visit  Medication Sig Dispense Refill   GLIPIZIDE  PO Take 2 tablets by mouth.     INVEGA SUSTENNA 156 MG/ML SUSY injection Inject 156 mg into the muscle as directed.     Semaglutide  (RYBELSUS  PO) Take by mouth.     triamcinolone  cream (KENALOG ) 0.1 % Apply 1 Application topically 2 (two) times daily. To affected area till better 80 g 0   No current facility-administered medications on file prior to visit.     No Known Allergies  Past Medical History:  Diagnosis Date   Chronic back pain    Diabetes mellitus without complication (HCC)    Hyperlipidemia    Schizophrenia (HCC)      Past Surgical History:  Procedure Laterality Date   ABDOMINAL HYSTERECTOMY     partial   BACK SURGERY       Family History  Problem Relation Age of Onset   Kidney disease Father     Social History   Socioeconomic History   Marital status: Single    Spouse name: Not on file   Number of children: Not on file   Years of education: Not on file   Highest education level: Not on file  Occupational History   Not on file  Tobacco Use   Smoking status: Former    Current packs/day: 1.00  Types: Cigarettes   Smokeless tobacco: Never  Vaping Use   Vaping status: Never Used  Substance and Sexual Activity   Alcohol use: Not Currently   Drug use: Never   Sexual activity: Yes    Birth control/protection: None  Other Topics Concern   Not on file  Social History Narrative   Not on file   Social Drivers of Health   Financial Resource Strain: Not on file  Food Insecurity: No Food Insecurity (07/29/2023)   Hunger Vital Sign    Worried About Running Out of Food in the Last Year: Never true    Ran Out of Food in the Last Year: Never true  Transportation Needs:  No Transportation Needs (07/29/2023)   PRAPARE - Administrator, Civil Service (Medical): No    Lack of Transportation (Non-Medical): No  Physical Activity: Not on file  Stress: Not on file  Social Connections: Not on file  Intimate Partner Violence: Not At Risk (07/29/2023)   Humiliation, Afraid, Rape, and Kick questionnaire    Fear of Current or Ex-Partner: No    Emotionally Abused: No    Physically Abused: No    Sexually Abused: No    SDOH Interventions Today    Flowsheet Row Most Recent Value  SDOH Interventions   Food Insecurity Interventions Intervention Not Indicated  Housing Interventions Intervention Not Indicated  Transportation Interventions Intervention Not Indicated  Utilities Interventions Intervention Not Indicated        Health Maintenance  Topic Date Due   COVID-19 Vaccine (1) Never done   Hepatitis C Screening  Never done   DTaP/Tdap/Td vaccine (2 - Td or Tdap) 12/02/2004   Pap with HPV screening  Never done   Colon Cancer Screening  Never done   Medicare Annual Wellness Visit  02/22/2023   Flu Shot  Completed   HIV Screening  Completed   HPV Vaccine  Aged Out    Objective    BP 114/70   Pulse 80   Resp 16   Ht 5' 3 (1.6 m)   Wt 183 lb 3.2 oz (83.1 kg)   SpO2 98%   BMI 32.45 kg/m   Physical Exam Vitals reviewed.  Constitutional:      Appearance: She is obese.  HENT:     Head: Normocephalic.     Right Ear: Tympanic membrane and external ear normal.     Left Ear: External ear normal. There is impacted cerumen.     Nose: Nose normal.  Eyes:     Extraocular Movements: Extraocular movements intact.  Cardiovascular:     Rate and Rhythm: Normal rate and regular rhythm.  Pulmonary:     Effort: Pulmonary effort is normal.     Breath sounds: Normal breath sounds.  Abdominal:     General: Bowel sounds are normal. There is distension.     Palpations: Abdomen is soft.  Musculoskeletal:        General: Normal range of motion.      Cervical back: Normal range of motion and neck supple.  Skin:    General: Skin is warm and dry.  Neurological:     Mental Status: She is alert and oriented to person, place, and time.  Psychiatric:        Behavior: Behavior normal.     Comments: God has me in his hands illogical reasoning and thought process      Assessment & Plan:  Chauncey was seen today for new patient (initial visit) and diabetes.  Diagnoses and all orders for this visit:  Need for hepatitis C screening test -     HCV Ab w Reflex to Quant PCR  Uncontrolled type 2 diabetes mellitus with hyperglycemia, without long-term current use of insulin (HCC) See HPI increased diabetes medication  -     POCT glucose (manual entry) -     POCT glycosylated hemoglobin (Hb A1C) -     Microalbumin / creatinine urine ratio -     CBC with Differential/Platelet -     Lipid panel  Medication management -     CMP14+EGFR  Encounter for HCV screening test for low risk patient -     HCV Ab w Reflex to Quant PCR  Colon cancer screening -     Cologuard    Impacted cerumen of left ear  Schedule ear irrigation removed the right ear cerumen removed manually with curette   Follow-up:  Return in about 3 months (around 10/27/2023) for DM.  The above assessment and management plan was discussed with the patient. The patient verbalized understanding of and has agreed to the management plan. Patient is aware to call the clinic if symptoms fail to improve or worsen. Patient is aware when to return to the clinic for a follow-up visit. Patient educated on when it is appropriate to go to the emergency department.   Rosaline Bohr, NP-C

## 2023-07-29 NOTE — Patient Instructions (Signed)
 Pneumococcal Conjugate Vaccine: What You Need to Know Many vaccine information statements are available in Spanish and other languages. See promoage.com.br. 1. Why get vaccinated? Pneumococcal conjugate vaccine can prevent pneumococcal disease. Pneumococcal disease refers to any illness caused by pneumococcal bacteria. These bacteria can cause many types of illnesses, including pneumonia, which is an infection of the lungs. Pneumococcal bacteria are one of the most common causes of pneumonia. Besides pneumonia, pneumococcal bacteria can also cause: Ear infections Sinus infections Meningitis (infection of the tissue covering the brain and spinal cord) Bacteremia (infection of the blood) Anyone can get pneumococcal disease, but children under 61 years old, people with certain medical conditions or other risk factors, and adults 65 years or older are at the highest risk. Most pneumococcal infections are mild. However, some can result in long-term problems, such as brain damage or hearing loss. Meningitis, bacteremia, and pneumonia caused by pneumococcal disease can be fatal. 2. Pneumococcal conjugate vaccine Pneumococcal conjugate vaccine helps protect against bacteria that cause pneumococcal disease. There are three pneumococcal conjugate vaccines (PCV13, PCV15, and PCV20). The different vaccines are recommended for different people based on age and medical status. Your health care provider can help you determine which type of pneumococcal conjugate vaccine, and how many doses, you should receive. Infants and young children usually need 4 doses of pneumococcal conjugate vaccine. These doses are recommended at 2, 4, 6, and 12-39 months of age. Older children and adolescents might need pneumococcal conjugate vaccine depending on their age and medical conditions or other risk factors if they did not receive the recommended doses as infants or young children. Adults 19 through 73 years old with certain  medical conditions or other risk factors who have not already received pneumococcal conjugate vaccine should receive pneumococcal conjugate vaccine. Adults 65 years or older who have not previously received pneumococcal conjugate vaccine should receive pneumococcal conjugate vaccine. Some people with certain medical conditions are also recommended to receive pneumococcal polysaccharide vaccine (a different type of pneumococcal vaccine known as PPSV23). Some adults who have previously received a pneumococcal conjugate vaccine may be recommended to receive another pneumococcal conjugate vaccine. 3. Talk with your health care provider Tell your vaccination provider if the person getting the vaccine: Has had an allergic reaction after a previous dose of any type of pneumococcal conjugate vaccine (PCV13, PCV15, PCV20, or an earlier pneumococcal conjugate vaccine known as PCV7), or to any vaccine containing diphtheria toxoid (for example, DTaP), or has any severe, life-threatening allergies In some cases, your health care provider may decide to postpone pneumococcal conjugate vaccination until a future visit. People with minor illnesses, such as a cold, may be vaccinated. People who are moderately or severely ill should usually wait until they recover. Your health care provider can give you more information. 4. Risks of a vaccine reaction Redness, swelling, pain, or tenderness where the shot is given, and fever, loss of appetite, fussiness (irritability), feeling tired, headache, muscle aches, joint pain, and chills can happen after pneumococcal conjugate vaccination. Young children may be at increased risk for seizures caused by fever after a pneumococcal conjugate vaccine if it is administered at the same time as inactivated influenza vaccine. Ask your health care provider for more information. People sometimes faint after medical procedures, including vaccination. Tell your provider if you feel dizzy or  have vision changes or ringing in the ears. As with any medicine, there is a very remote chance of a vaccine causing a severe allergic reaction, other serious injury, or death. 5.  What if there is a serious problem? An allergic reaction could occur after the vaccinated person leaves the clinic. If you see signs of a severe allergic reaction (hives, swelling of the face and throat, difficulty breathing, a fast heartbeat, dizziness, or weakness), call 9-1-1 and get the person to the nearest hospital. For other signs that concern you, call your health care provider. Adverse reactions should be reported to the Vaccine Adverse Event Reporting System (VAERS). Your health care provider will usually file this report, or you can do it yourself. Visit the VAERS website at www.vaers.lagents.no or call 417-592-7131. VAERS is only for reporting reactions, and VAERS staff members do not give medical advice. 6. The National Vaccine Injury Compensation Program The [employer] Vaccine Injury Compensation Program (VICP) is a federal program that was created to compensate people who may have been injured by certain vaccines. Claims regarding alleged injury or death due to vaccination have a time limit for filing, which may be as short as two years. Visit the VICP website at spiritualword.at or call (912)139-4230 to learn about the program and about filing a claim. 7. How can I learn more? Ask your health care provider. Call your local or state health department. Visit the website of the Food and Drug Administration (FDA) for vaccine package inserts and additional information at finderlist.no. Contact the Centers for Disease Control and Prevention (CDC): Call (351) 866-6622 (1-800-CDC-INFO) or Visit CDC's website at piccapture.uy. Source: CDC Vaccine Information Statement (Interim) Pneumococcal Conjugate Vaccine (11/30/2021) This same material is available at  footballexhibition.com.br for no charge. This information is not intended to replace advice given to you by your health care provider. Make sure you discuss any questions you have with your health care provider. Document Revised: 10/23/2022 Document Reviewed: 07/29/2022 Elsevier Patient Education  2024 Else[employer] Inc.   Td (Tetanus, Diphtheria) Vaccine: What You Need to Know Many vaccine information statements are available in Spanish and other languages. See promoage.com.br. 1. Why get vaccinated? Td vaccine can prevent tetanus and diphtheria. Tetanus enters the body through cuts or wounds. Diphtheria spreads from person to person. TETANUS (T) causes painful stiffening of the muscles. Tetanus can lead to serious health problems, including being unable to open the mouth, having trouble swallowing and breathing, or death. DIPHTHERIA (D) can lead to difficulty breathing, heart failure, paralysis, or death. 2. Td vaccine Td is only for children 7 years and older, adolescents, and adults.  Td is usually given as a booster dose every 10 years, or after 5 years in the case of a severe or dirty wound or burn. Another vaccine, called Tdap, may be used instead of Td. Tdap protects against pertussis, also known as whooping cough, in addition to tetanus and diphtheria. Td may be given at the same time as other vaccines. 3. Talk with your health care provider Tell your vaccination provider if the person getting the vaccine: Has had an allergic reaction after a previous dose of any vaccine that protects against tetanus or diphtheria, or has any severe, life-threatening allergies Has ever had Guillain-Barr Syndrome (also called GBS) Has had severe pain or swelling after a previous dose of any vaccine that protects against tetanus or diphtheria In some cases, your health care provider may decide to postpone Td vaccination until a future visit. People with minor illnesses, such as a cold, may be vaccinated.  People who are moderately or severely ill should usually wait until they recover before getting Td vaccine.  Your health care provider can give you more information.  4. Risks of a vaccine reaction Pain, redness, or swelling where the shot was given, mild fever, headache, feeling tired, and nausea, vomiting, diarrhea, or stomachache sometimes happen after Td vaccination. People sometimes faint after medical procedures, including vaccination. Tell your provider if you feel dizzy or have vision changes or ringing in the ears.  As with any medicine, there is a very remote chance of a vaccine causing a severe allergic reaction, other serious injury, or death. 5. What if there is a serious problem? An allergic reaction could occur after the vaccinated person leaves the clinic. If you see signs of a severe allergic reaction (hives, swelling of the face and throat, difficulty breathing, a fast heartbeat, dizziness, or weakness), call 9-1-1 and get the person to the nearest hospital.  For other signs that concern you, call your health care provider.  Adverse reactions should be reported to the Vaccine Adverse Event Reporting System (VAERS). Your health care provider will usually file this report, or you can do it yourself. Visit the VAERS website at www.vaers.lagents.no or call 385-637-4354. VAERS is only for reporting reactions, and VAERS staff members do not give medical advice. 6. The National Vaccine Injury Compensation Program The [employer] Vaccine Injury Compensation Program (VICP) is a federal program that was created to compensate people who may have been injured by certain vaccines. Claims regarding alleged injury or death due to vaccination have a time limit for filing, which may be as short as two years. Visit the VICP website at spiritualword.at or call (575)599-7324 to learn about the program and about filing a claim. 7. How can I learn more? Ask your health care provider. Call your  local or state health department. Visit the website of the Food and Drug Administration (FDA) for vaccine package inserts and additional information at finderlist.no. Contact the Centers for Disease Control and Prevention (CDC): Call 443-389-4869 (1-800-CDC-INFO) or Visit CDC's website at piccapture.uy. Source: CDC Vaccine Information Statement Td (Tetanus, Diphtheria) Vaccine (02/25/2020) This same material is available at footballexhibition.com.br for no charge. This information is not intended to replace advice given to you by your health care provider. Make sure you discuss any questions you have with your health care provider. Document Revised: 10/23/2022 Document Reviewed: 08/23/2022 Elsevier Patient Education  2024 [employer].

## 2023-07-30 DIAGNOSIS — Z23 Encounter for immunization: Secondary | ICD-10-CM | POA: Diagnosis not present

## 2023-07-30 LAB — CBC WITH DIFFERENTIAL/PLATELET
Basophils Absolute: 0 10*3/uL (ref 0.0–0.2)
Basos: 1 %
EOS (ABSOLUTE): 0 10*3/uL (ref 0.0–0.4)
Eos: 0 %
Hematocrit: 42.1 % (ref 34.0–46.6)
Hemoglobin: 13.8 g/dL (ref 11.1–15.9)
Immature Grans (Abs): 0.1 10*3/uL (ref 0.0–0.1)
Immature Granulocytes: 1 %
Lymphocytes Absolute: 2.4 10*3/uL (ref 0.7–3.1)
Lymphs: 33 %
MCH: 30.9 pg (ref 26.6–33.0)
MCHC: 32.8 g/dL (ref 31.5–35.7)
MCV: 94 fL (ref 79–97)
Monocytes Absolute: 0.7 10*3/uL (ref 0.1–0.9)
Monocytes: 9 %
Neutrophils Absolute: 4.1 10*3/uL (ref 1.4–7.0)
Neutrophils: 56 %
Platelets: 294 10*3/uL (ref 150–450)
RBC: 4.47 x10E6/uL (ref 3.77–5.28)
RDW: 11.8 % (ref 11.7–15.4)
WBC: 7.2 10*3/uL (ref 3.4–10.8)

## 2023-07-30 LAB — CMP14+EGFR
ALT: 27 [IU]/L (ref 0–32)
AST: 15 [IU]/L (ref 0–40)
Albumin: 4.3 g/dL (ref 3.9–4.9)
Alkaline Phosphatase: 131 [IU]/L — ABNORMAL HIGH (ref 44–121)
BUN/Creatinine Ratio: 22 (ref 9–23)
BUN: 13 mg/dL (ref 6–24)
Bilirubin Total: <0.2 mg/dL (ref 0.0–1.2)
CO2: 24 mmol/L (ref 20–29)
Calcium: 9.1 mg/dL (ref 8.7–10.2)
Chloride: 98 mmol/L (ref 96–106)
Creatinine, Ser: 0.6 mg/dL (ref 0.57–1.00)
Globulin, Total: 2.8 g/dL (ref 1.5–4.5)
Glucose: 353 mg/dL — ABNORMAL HIGH (ref 70–99)
Potassium: 4.2 mmol/L (ref 3.5–5.2)
Sodium: 139 mmol/L (ref 134–144)
Total Protein: 7.1 g/dL (ref 6.0–8.5)
eGFR: 112 mL/min/{1.73_m2} (ref >59)

## 2023-07-30 LAB — MICROALBUMIN / CREATININE URINE RATIO
Creatinine, Urine: 84.3 mg/dL
Microalb/Creat Ratio: <4 mg/g{creat} (ref 0–29)
Microalbumin, Urine: <3 ug/mL

## 2023-07-30 LAB — LIPID PANEL
Chol/HDL Ratio: 6.2 {ratio} — ABNORMAL HIGH (ref 0.0–4.4)
Cholesterol, Total: 302 mg/dL — ABNORMAL HIGH (ref 100–199)
HDL: 49 mg/dL (ref >39)
LDL Chol Calc (NIH): 220 mg/dL — ABNORMAL HIGH (ref 0–99)
Triglycerides: 171 mg/dL — ABNORMAL HIGH (ref 0–149)
VLDL Cholesterol Cal: 33 mg/dL (ref 5–40)

## 2023-07-30 LAB — HCV INTERPRETATION

## 2023-07-30 LAB — HCV AB W REFLEX TO QUANT PCR: HCV Ab: NONREACTIVE

## 2023-08-06 ENCOUNTER — Other Ambulatory Visit (INDEPENDENT_AMBULATORY_CARE_PROVIDER_SITE_OTHER): Payer: Self-pay | Admitting: Primary Care

## 2023-08-06 DIAGNOSIS — E782 Mixed hyperlipidemia: Secondary | ICD-10-CM

## 2023-08-06 MED ORDER — ATORVASTATIN CALCIUM 80 MG PO TABS: 80.0000 mg | ORAL_TABLET | Freq: Every day | ORAL | 3 refills | Status: AC

## 2023-08-15 LAB — COLOGUARD: COLOGUARD: NEGATIVE

## 2023-08-22 ENCOUNTER — Telehealth (INDEPENDENT_AMBULATORY_CARE_PROVIDER_SITE_OTHER): Payer: Self-pay | Admitting: Primary Care

## 2023-08-22 ENCOUNTER — Ambulatory Visit: Payer: Self-pay | Admitting: Nurse Practitioner

## 2023-08-22 NOTE — Telephone Encounter (Signed)
E2c2 called in and stated that pt [phone] was refusing to go to the hospital. Nurse Judeth Cornfield wanted to inform office

## 2023-08-22 NOTE — Telephone Encounter (Signed)
Noted agreed ED

## 2023-08-22 NOTE — Telephone Encounter (Signed)
   Chief Complaint: ear pain Symptoms: left ear pain 10/10, Frequency: 07/29/2023 - patient stated provider stuck something in her ear and ever since she has been in pain, ringing in the hearing, strong/funny  urine odor & BS in 300's, Pertinent Negatives: Patient denies  Disposition: [x] ED /[] Urgent Care (no appt availability in office) / [] Appointment(In office/virtual)/ []  [facility]/ [] Home Care/ [] Refused Recommended Disposition /[] [facility]/ []  Follow-up with PCP Additional Notes: pt also c/o tingling & sharp pain in vagina, fells like yeast infection, vaginal itching.  The pt call was very difficult as pt was verbally abusive on the phone from the beginning of the phone call - pt was using curse words, rasing her voice, and display angry to the nurse throughout the conversation.  Pt remarks such as "this is what you get when you piss me off", "Make the damn appointment", etc, etc.. No appointments available in office today with PCP recommended pt go to ER due to elevated BS & the possibility of ketones in urine: pt refused and become even more anger: nurse informed pt would document refusal & difficult call, route to PCP office.. Reason for Disposition . [1] Stiff neck (can't touch chin to chest) AND [2] fever    Pt called r/t 10/10 left ear pain: then also stated BS in 300's, strong/funny urine odor.  Recommended pt go to ED: pt refused: pt was a difficult caller throughout phone call  Answer Assessment - Initial Assessment Questions 1. LOCATION: "Which ear is involved?"     Left ear 2. ONSET: "When did the ear start hurting"      07/29/2023 3. SEVERITY: "How bad is the pain?"  (Scale 1-10; mild, moderate or severe)   - MILD (1-3): doesn't interfere with normal activities    - MODERATE (4-7): interferes with normal activities or awakens from sleep    - SEVERE (8-10): excruciating pain, unable to do any normal activities      10/10 4. URI SYMPTOMS: "Do you have a  runny nose or cough?"     N/a 5. FEVER: "Do you have a fever?" If Yes, ask: "What is your temperature, how was it measured, and when did it start?"     no 6. CAUSE: "Have you been swimming recently?", "How often do you use Q-TIPS?", "Have you had any recent air travel or scuba diving?"     Since provider 7. OTHER SYMPTOMS: "Do you have any other symptoms?" (e.g., headache, stiff neck, dizziness, vomiting, runny nose, decreased hearing)     Left ear pain 10/10, ringing in the hearing, tingling & sharp pain in vagina, strong urine odor & BS in 300's, fells like yeast infection, vaginal itching 8. PREGNANCY: "Is there any chance you are pregnant?" "When was your last menstrual period?"     N/a  Protocols used: Davina Poke

## 2023-08-25 NOTE — Telephone Encounter (Signed)
 Noted

## 2023-09-01 ENCOUNTER — Ambulatory Visit (INDEPENDENT_AMBULATORY_CARE_PROVIDER_SITE_OTHER): Payer: 59 | Admitting: Primary Care

## 2023-09-01 ENCOUNTER — Other Ambulatory Visit (HOSPITAL_COMMUNITY)
Admission: RE | Admit: 2023-09-01 | Discharge: 2023-09-01 | Disposition: A | Discharge status: Home / Self Care | Payer: 59 | Source: Ambulatory Visit | Attending: Primary Care | Admitting: Primary Care

## 2023-09-01 ENCOUNTER — Encounter (INDEPENDENT_AMBULATORY_CARE_PROVIDER_SITE_OTHER): Payer: Self-pay | Admitting: Primary Care

## 2023-09-01 VITALS — BP 127/81 | HR 91 | Resp 16 | Ht 63.0 in | Wt 184.2 lb

## 2023-09-01 DIAGNOSIS — H6122 Impacted cerumen, left ear: Secondary | ICD-10-CM

## 2023-09-01 DIAGNOSIS — Z1151 Encounter for screening for human papillomavirus (HPV): Secondary | ICD-10-CM | POA: Diagnosis not present

## 2023-09-01 DIAGNOSIS — Z124 Encounter for screening for malignant neoplasm of cervix: Secondary | ICD-10-CM | POA: Diagnosis present

## 2023-09-01 DIAGNOSIS — Z113 Encounter for screening for infections with a predominantly sexual mode of transmission: Secondary | ICD-10-CM

## 2023-09-01 NOTE — Progress Notes (Signed)
  [facility]  WELL-WOMAN PHYSICAL & PAP Patient name: Melody Crawford MRN [id number]  Date of birth: 1975/08/29 Chief Complaint:   Gynecologic Exam and Otalgia (Left ear)  History of Present Illness:   Amanada Crawford is a 48 y.o. No obstetric history on file. female being seen today for a routine well-woman exam.   CC: left ear pain - cerumen impaction   The current method of family planning is none.  No LMP recorded. Patient has had a hysterectomy. Last pap unknown . Results were: normal Last mammogram: 2024 ([place] [facility] . Results were: normal. Family h/o breast cancer: YES maternal aunts (2)  Last colonoscopy: Cologuard 08/08/26 Results were: normal. Family h/o colorectal cancer: No  Health Maintenance  Topic Date Due   COVID-19 Vaccine (1) Never done   Pap with HPV screening  Never done   Medicare Annual Wellness Visit  02/22/2023   Yearly kidney function blood test for diabetes  07/28/2024   Yearly kidney health urinalysis for diabetes  07/28/2024   Cologuard (Stool DNA test)  08/08/2026   DTaP/Tdap/Td vaccine (3 - Td or Tdap) 07/29/2033   Pneumococcal Vaccination  Completed   Flu Shot  Completed   Hepatitis C Screening  Completed   HIV Screening  Completed   HPV Vaccine  Aged Out   Review of Systems:    Denies any headaches, blurred vision, fatigue, shortness of breath, chest pain, abdominal pain, abnormal vaginal discharge/itching/odor/irritation, problems with periods, bowel movements, urination, or intercourse unless otherwise stated above.  Pertinent History Reviewed:   Reviewed past medical,surgical, social and family history.  Reviewed problem list, medications and allergies.  Physical Assessment:   Vitals:   09/01/23 0950  BP: 127/81  Pulse: 91  Resp: 16  SpO2: 99%  Weight: 184 lb 3.2 oz (83.6 kg)  Height: 5\' 3"  (1.6 m)  Body mass index is 32.63 kg/m.        Physical Examination:  General appearance - well appearing, and  in no distress Mental status - alert, oriented to person, place, and time Psych:  She has a normal mood and affect Ear left cerumen impaction pain Skin - warm and dry, normal color, no suspicious lesions noted Chest - effort normal, all lung fields clear to auscultation bilaterally Heart - normal rate and regular rhythm Neck:  midline trachea, no thyromegaly or nodules Breasts - Educated patient on proper self breast examination and had patient to demonstrate SBE. Abdomen - soft, nontender, nondistended, no masses or organomegaly Pelvic-VULVA: normal appearing vulva with no masses, tenderness or lesions   VAGINA: normal appearing vagina with normal color and discharge, no lesions   CERVIX: normal appearing cervix without discharge or lesions, no CMT UTERUS: uterus is felt to be normal size, shape, consistency and nontender  ADNEXA: No adnexal masses or tenderness noted. Extremities:  No swelling or varicosities noted  No results found for this or any previous visit (from the past 24 hours).   Assessment & Plan:  [name] was seen today for gynecologic exam and otalgia.  Diagnoses and all orders for this visit:  Cervical cancer screening -     Cytology - PAP  Screening for STD (sexually transmitted disease) -     Cervicovaginal ancillary only  Impacted cerumen of left ear  Schedule ear irrigation   This note has been created with Education officer, environmental. Any transcriptional errors are unintentional.   Marius Siemens, NP 09/01/2023, 10:36 AM

## 2023-09-02 LAB — CERVICOVAGINAL ANCILLARY ONLY
Bacterial Vaginitis (gardnerella): NEGATIVE
Candida Glabrata: POSITIVE — AB
Candida Vaginitis: NEGATIVE
Chlamydia: NEGATIVE
Comment: NEGATIVE
Comment: NEGATIVE
Comment: NEGATIVE
Comment: NEGATIVE
Comment: NEGATIVE
Comment: NORMAL
Neisseria Gonorrhea: NEGATIVE
Trichomonas: NEGATIVE

## 2023-09-03 ENCOUNTER — Other Ambulatory Visit (INDEPENDENT_AMBULATORY_CARE_PROVIDER_SITE_OTHER): Payer: Self-pay | Admitting: Primary Care

## 2023-09-03 MED ORDER — FLUCONAZOLE 150 MG PO TABS: 150.0000 mg | ORAL_TABLET | Freq: Every day | ORAL | 1 refills | Status: DC

## 2023-09-05 ENCOUNTER — Ambulatory Visit (INDEPENDENT_AMBULATORY_CARE_PROVIDER_SITE_OTHER): Payer: 59

## 2023-09-05 LAB — CYTOLOGY - PAP
Adequacy: ABSENT
Comment: NEGATIVE
Comment: NEGATIVE
Comment: NEGATIVE
Diagnosis: HIGH — AB
HPV 16: POSITIVE — AB
HPV 18 / 45: NEGATIVE
High risk HPV: POSITIVE — AB

## 2023-09-08 ENCOUNTER — Other Ambulatory Visit (INDEPENDENT_AMBULATORY_CARE_PROVIDER_SITE_OTHER): Payer: Self-pay | Admitting: Primary Care

## 2023-09-08 NOTE — Telephone Encounter (Signed)
Requested medication (s) are due for refill today: na  Requested medication (s) are on the active medication list: no  Last refill:  na  Future visit scheduled: yes in 1 month  Notes to clinic:  medication not on med list. Do you want to order / refill Rx?     Requested Prescriptions  Pending Prescriptions Disp Refills   ONETOUCH VERIO test strip [Pharmacy Med Name: ONE TOUCH VERIO TEST ST(NEW)100S] 600 strip     Sig: CHECK BLOOD SUGAR 6 TIMES PER DAY BEFORE AND AFTER MEALS     Endocrinology: Diabetes - Testing Supplies Passed - 09/08/2023  3:40 PM      Passed - Valid encounter within last 12 months    Recent Outpatient Visits           1 week ago Cervical cancer screening   [place] [facility] Grayce Sessions, NP   1 month ago Need for hepatitis C screening test   [place] [facility] Grayce Sessions, NP       Future Appointments             In 1 month Randa Evens, Kinnie Scales, NP [place] [facility]

## 2023-09-09 ENCOUNTER — Ambulatory Visit (INDEPENDENT_AMBULATORY_CARE_PROVIDER_SITE_OTHER): Payer: Self-pay | Admitting: Primary Care

## 2023-09-09 NOTE — Telephone Encounter (Signed)
patient is calling in because the pharmacy has been reaching out about patient getting her diabetes test strips please call patient [phone]   Copied from CRM [id number]. Topic: Clinical - Prescription Issue >> Sep 09, 2023  4:25 PM Geneva B wrote: Reason for CRM: patient is calling in because the pharmacy has been reaching out about patient getting her diabetes test strips please call patient [phone]

## 2023-09-10 ENCOUNTER — Telehealth (INDEPENDENT_AMBULATORY_CARE_PROVIDER_SITE_OTHER): Payer: Self-pay | Admitting: Primary Care

## 2023-09-10 ENCOUNTER — Other Ambulatory Visit: Payer: Self-pay

## 2023-09-10 NOTE — Telephone Encounter (Signed)
 Noted

## 2023-09-10 NOTE — Telephone Encounter (Signed)
Spoke with [name], Pharmacist at [facility] one touch verio test scripts 600 count with one refill authorization given. Provider aware.

## 2023-09-10 NOTE — Telephone Encounter (Signed)
 noted

## 2023-09-10 NOTE — Telephone Encounter (Signed)
September 10, 2023 [name], RN  spoke with patient regarding refill request on  09/10/23 11:41 AM See Note below Spoke with [name], Pharmacist at [facility] one touch verio test scripts 600 count with one refill authorization given. Provider aware.    Therefore refill request is a duplicate

## 2023-09-10 NOTE — Telephone Encounter (Signed)
Copied from CRM [id number]. Topic: Clinical - Medication Refill >> Sep 10, 2023  8:52 AM Everette C wrote: Most Recent Primary Care Visit:  Provider: [facility]  [facility]  Visit Type: NURSE VISIT  Date: 09/05/2023  Medication: test strips for One Touch Verio   Has the patient contacted their pharmacy? Yes (Agent: If no, request that the patient contact the pharmacy for the refill. If patient does not wish to contact the pharmacy document the reason why and proceed with request.) (Agent: If yes, when and what did the pharmacy advise?)  Is this the correct pharmacy for this prescription? Yes If no, delete pharmacy and type the correct one.  This is the patient's preferred pharmacy:  [facility] [address], [place] - [address] AT [facility] [address] [place] [address] Phone: [phone] Fax: [phone]   Has the prescription been filled recently? No  Is the patient out of the medication? Yes  Has the patient been seen for an appointment in the last year OR does the patient have an upcoming appointment? Yes  Can we respond through MyChart? No  Agent: Please be advised that Rx refills may take up to 3 business days. We ask that you follow-up with your pharmacy.

## 2023-09-15 ENCOUNTER — Ambulatory Visit (INDEPENDENT_AMBULATORY_CARE_PROVIDER_SITE_OTHER): Payer: 59

## 2023-10-20 ENCOUNTER — Telehealth (INDEPENDENT_AMBULATORY_CARE_PROVIDER_SITE_OTHER): Payer: Self-pay | Admitting: Primary Care

## 2023-10-20 NOTE — Telephone Encounter (Signed)
 Left VM with pt about their upcoming appt.

## 2023-10-27 ENCOUNTER — Encounter (INDEPENDENT_AMBULATORY_CARE_PROVIDER_SITE_OTHER): Payer: Self-pay | Admitting: Primary Care

## 2023-10-27 ENCOUNTER — Ambulatory Visit (INDEPENDENT_AMBULATORY_CARE_PROVIDER_SITE_OTHER): Payer: Self-pay | Admitting: Primary Care

## 2023-10-27 VITALS — BP 106/67 | HR 100 | Resp 16 | Wt 185.0 lb

## 2023-10-27 DIAGNOSIS — Z7985 Long-term (current) use of injectable non-insulin antidiabetic drugs: Secondary | ICD-10-CM | POA: Diagnosis not present

## 2023-10-27 DIAGNOSIS — E1165 Type 2 diabetes mellitus with hyperglycemia: Secondary | ICD-10-CM

## 2023-10-27 LAB — POCT URINALYSIS DIP (CLINITEK)
Bilirubin, UA: NEGATIVE
Blood, UA: NEGATIVE
Glucose, UA: >=1000 mg/dL — AB
Leukocytes, UA: NEGATIVE
Nitrite, UA: NEGATIVE
POC PROTEIN,UA: NEGATIVE
Spec Grav, UA: 1.025 (ref 1.010–1.025)
Urobilinogen, UA: 0.2 U/dL
pH, UA: 6 (ref 5.0–8.0)

## 2023-10-27 LAB — POCT GLYCOSYLATED HEMOGLOBIN (HGB A1C): HbA1c, POC (controlled diabetic range): 12.4 % — AB (ref 0.0–7.0)

## 2023-10-27 LAB — GLUCOSE, POCT (MANUAL RESULT ENTRY): POC Glucose: 349 mg/dL — AB (ref 70–99)

## 2023-10-27 MED ORDER — OZEMPIC (0.25 OR 0.5 MG/DOSE) 2 MG/3ML ~~LOC~~ SOPN: 0.2500 mg | PEN_INJECTOR | SUBCUTANEOUS | 1 refills | Status: DC

## 2023-10-27 NOTE — Progress Notes (Unsigned)
 Subjective:  Patient ID: Melody Crawford, female    DOB: Jan 21, 1976  Age: 48 y.o. MRN: [id number]  CC:  Type 2 Diabetes  Melody Crawford presents for Follow-up of diabetes. Patient does check blood sugar at home.  Patient daughter is present Melody Crawford she has given her permission to be in her appointment and also provide information.  Such as wanting [employer] sweet tea this morning.  Daughter did not comply HPI  Compliant with meds - yes Checking CBGs? Yes  Fasting avg - 200-360  Postprandial average -  Exercising regularly? - No Watching carbohydrate intake? - Yes Neuropathy ? - Yes Hypoglycemic events - Yes  - Recovers with :   Pertinent ROS:  Polyuria - Yes Polydipsia - Yes Vision problems - Yes Medications as noted below. Taking them regularly without complication/adverse reaction being reported today.   History Melody Crawford has a past medical history of Chronic back pain, Diabetes mellitus without complication (HCC), Hyperlipidemia, and Schizophrenia (HCC).   She has a past surgical history that includes Back surgery and Abdominal hysterectomy.   Her family history includes Kidney disease in her father.She reports that she has quit smoking. Her smoking use included cigarettes. She has never used smokeless tobacco. She reports that she does not currently use alcohol. She reports that she does not use drugs.  Current Outpatient Medications on File Prior to Visit  Medication Sig Dispense Refill   atorvastatin (LIPITOR) 80 MG tablet Take 1 tablet (80 mg total) by mouth daily. 90 tablet 3   fluconazole (DIFLUCAN) 150 MG tablet Take 1 tablet (150 mg total) by mouth daily. 1 tablet 1   glipiZIDE (GLUCOTROL XL) 10 MG 24 hr tablet Take 1 tablet (10 mg total) by mouth daily with breakfast. 90 tablet 1   INVEGA SUSTENNA 156 MG/ML SUSY injection Inject 156 mg into the muscle as directed.     Semaglutide (RYBELSUS) 14 MG TABS Take 1 tablet (14 mg total) by mouth daily. 90 tablet 1   triamcinolone  cream (KENALOG) 0.1 % Apply 1 Application topically 2 (two) times daily. To affected area till better 80 g 0   No current facility-administered medications on file prior to visit.    Review of Systems Comprehensive ROS Pertinent positive and negative noted in HPI   Objective:  There were no vitals taken for this visit.  BP Readings from Last 3 Encounters:  09/01/23 127/81  07/29/23 114/70  04/24/23 108/73    Wt Readings from Last 3 Encounters:  09/01/23 184 lb 3.2 oz (83.6 kg)  07/29/23 183 lb 3.2 oz (83.1 kg)  02/24/23 180 lb (81.6 kg)    Physical Exam  Lab Results  Component Value Date   HGBA1C 10.9 (A) 07/29/2023    Lab Results  Component Value Date   WBC 7.2 07/29/2023   HGB 13.8 07/29/2023   HCT 42.1 07/29/2023   PLT 294 07/29/2023   GLUCOSE 353 (H) 07/29/2023   CHOL 302 (H) 07/29/2023   TRIG 171 (H) 07/29/2023   HDL 49 07/29/2023   LDLCALC 220 (H) 07/29/2023   ALT 27 07/29/2023   AST 15 07/29/2023   NA 139 07/29/2023   K 4.2 07/29/2023   CL 98 07/29/2023   CREATININE 0.60 07/29/2023   BUN 13 07/29/2023   CO2 24 07/29/2023   HGBA1C 10.9 (A) 07/29/2023     Assessment & Plan:   Follow-up:  No follow-ups on file.  The above assessment and management plan was discussed with the patient. The patient verbalized  understanding of and has agreed to the management plan. Patient is aware to call the clinic if symptoms fail to improve or worsen. Patient is aware when to return to the clinic for a follow-up visit. Patient educated on when it is appropriate to go to the emergency department.   Gwinda Passe, NP-C

## 2023-10-28 ENCOUNTER — Encounter (INDEPENDENT_AMBULATORY_CARE_PROVIDER_SITE_OTHER): Payer: Self-pay | Admitting: Primary Care

## 2023-10-31 ENCOUNTER — Ambulatory Visit (INDEPENDENT_AMBULATORY_CARE_PROVIDER_SITE_OTHER)

## 2023-11-13 DIAGNOSIS — F251 Schizoaffective disorder, depressive type: Secondary | ICD-10-CM | POA: Diagnosis not present

## 2024-01-19 ENCOUNTER — Other Ambulatory Visit (INDEPENDENT_AMBULATORY_CARE_PROVIDER_SITE_OTHER): Payer: Self-pay

## 2024-01-19 MED ORDER — GLIPIZIDE ER 10 MG PO TB24: 10.0000 mg | ORAL_TABLET | Freq: Every day | ORAL | 1 refills | Status: AC

## 2024-01-22 ENCOUNTER — Ambulatory Visit (HOSPITAL_COMMUNITY): Admission: EM | Admit: 2024-01-22 | Discharge: 2024-01-22 | Disposition: A | Discharge status: Home / Self Care

## 2024-01-22 ENCOUNTER — Encounter (HOSPITAL_COMMUNITY): Payer: Self-pay

## 2024-01-22 DIAGNOSIS — N644 Mastodynia: Secondary | ICD-10-CM

## 2024-01-22 DIAGNOSIS — W57XXXA Bitten or stung by nonvenomous insect and other nonvenomous arthropods, initial encounter: Secondary | ICD-10-CM

## 2024-01-22 DIAGNOSIS — R21 Rash and other nonspecific skin eruption: Secondary | ICD-10-CM

## 2024-01-22 MED ORDER — TRIAMCINOLONE ACETONIDE 0.1 % EX CREA: 1.0000 | TOPICAL_CREAM | Freq: Two times a day (BID) | CUTANEOUS | 0 refills | Status: AC

## 2024-01-22 NOTE — ED Triage Notes (Signed)
 Patient here today with c/o red sports on her arms and legs that she noticed today. Denies itching. Denies pain.

## 2024-01-22 NOTE — ED Provider Notes (Signed)
 [facility]    CSN: [id number] Arrival date & time: 01/22/24  1816      History   Chief Complaint Chief Complaint  Patient presents with   Red Spots    Legs and Arms    HPI Melody Crawford is a 48 y.o. female.   Patient comes today with several concerns.  Her primary concern today is multiple red papules noted on her lateral right leg and arm.  She reports that they are a little bit irritated and slightly pruritic but denies any significant pruritus or pain.  She has not applied any topical medications.  She does have dogs but they do not have fleas.  She has been spending more time outside but denies any new exposures to plants, insects, animals.  She denies history of dermatological condition.  Denies any changes to personal hygiene products including soaps or detergents.  She denies any fever, nausea, vomiting.  In addition, she reports some right medial breast pain.  She reports that pain is rated 5/7 on a certain pain scale, described as fullness/discomfort, no alleviating factors identified.  She does report being up-to-date on her mammograms.  The last 1 that is available in care everywhere was from 07/26/2021 that was normal.  She denies any skin change, erythema, fever, nausea, vomiting, abnormal discharge.  She has not tried any over-the-counter medication for symptom management.    Past Medical History:  Diagnosis Date   Chronic back pain    Diabetes mellitus without complication (HCC)    Hyperlipidemia    Schizophrenia (HCC)     There are no active problems to display for this patient.   Past Surgical History:  Procedure Laterality Date   ABDOMINAL HYSTERECTOMY     partial   BACK SURGERY      OB History   No obstetric history on file.      Home Medications    Prior to Admission medications   Medication Sig Start Date End Date Taking? Authorizing Provider  hydrOXYzine (ATARAX) 25 MG tablet Take 25 mg by mouth 3 (three) times daily as needed.  11/13/23  Yes [provider]  mirtazapine (REMERON) 7.5 MG tablet Take 7.5 mg by mouth at bedtime. 11/13/23  Yes [provider]  Semaglutide  (RYBELSUS ) 14 MG TABS Take 1 tablet by mouth daily. 11/06/22  Yes [provider]  triamcinolone  cream (KENALOG ) 0.1 % Apply 1 Application topically 2 (two) times daily. 01/22/24  Yes Tarnisha Kachmar, Rocky POUR, PA-C  atorvastatin  (LIPITOR) 80 MG tablet Take 1 tablet (80 mg total) by mouth daily. 08/06/23   Celestia Rosaline SQUIBB, NP  glipiZIDE  (GLUCOTROL  XL) 10 MG 24 hr tablet Take 1 tablet (10 mg total) by mouth daily with breakfast. 01/19/24   Celestia Rosaline SQUIBB, NP  INVEGA SUSTENNA 156 MG/ML SUSY injection Inject 156 mg into the muscle as directed. 03/01/20   [provider]    Family History Family History  Problem Relation Age of Onset   Kidney disease Father     Social History Social History   Tobacco Use   Smoking status: Former    Current packs/day: 1.00    Types: Cigarettes   Smokeless tobacco: Never  Vaping Use   Vaping status: Never Used  Substance Use Topics   Alcohol use: Not Currently   Drug use: Never     Allergies   Patient has no known allergies.   Review of Systems Review of Systems  Constitutional:  Positive for activity change. Negative for appetite  change, fatigue and fever.  Respiratory:  Negative for shortness of breath.   Cardiovascular:  Negative for chest pain.  Gastrointestinal:  Negative for abdominal pain, diarrhea, nausea and vomiting.  Musculoskeletal:  Negative for arthralgias and myalgias.  Skin:  Positive for rash.  Neurological:  Negative for dizziness, weakness, light-headedness and headaches.     Physical Exam Triage Vital Signs ED Triage Vitals  Encounter Vitals Group     BP 01/22/24 1901 123/74     Girls Systolic BP Percentile --      Girls Diastolic BP Percentile --      Boys Systolic BP Percentile --      Boys Diastolic BP Percentile --      Pulse Rate 01/22/24 1901  (!) 104     Resp 01/22/24 1901 16     Temp 01/22/24 1901 98.4 F (36.9 C)     Temp Source 01/22/24 1901 Oral     SpO2 01/22/24 1901 98 %     Weight --      Height --      Head Circumference --      Peak Flow --      Pain Score 01/22/24 1903 0     Pain Loc --      Pain Education --      Exclude from Growth Chart --    No data found.  Updated Vital Signs BP 123/74 (BP Location: Left Arm)   Pulse 93   Temp 98.4 F (36.9 C) (Oral)   Resp 16   SpO2 98%   Visual Acuity Right Eye Distance:   Left Eye Distance:   Bilateral Distance:    Right Eye Near:   Left Eye Near:    Bilateral Near:     Physical Exam Vitals reviewed.  Constitutional:      General: She is awake. She is not in acute distress.    Appearance: Normal appearance. She is well-developed. She is not ill-appearing.     Comments: Very pleasant female presented age no acute distress sitting comfortably in exam room  HENT:     Head: Normocephalic and atraumatic.  Cardiovascular:     Rate and Rhythm: Normal rate and regular rhythm.     Heart sounds: Normal heart sounds, S1 normal and S2 normal. No murmur heard. Pulmonary:     Effort: Pulmonary effort is normal.     Breath sounds: Normal breath sounds. No wheezing, rhonchi or rales.     Comments: Clear to auscultation bilaterally Chest:  Breasts:    Right: Tenderness present. No swelling, inverted nipple, mass, nipple discharge or skin change.     Comments: Mild tenderness over medial right breast.  No palpable mass.  No skin change or nipple inversion.  No nipple discharge on exam. Lymphadenopathy:     Upper Body:     Right upper body: No supraclavicular adenopathy.     Left upper body: No supraclavicular adenopathy.  Skin:    Findings: Rash present. Rash is papular.     Comments: Scattered blanching erythematous lesions on bilateral right leg and hand/wrist.  No evidence of excoriation.  Psychiatric:        Behavior: Behavior is cooperative.      UC  Treatments / Results  Labs (all labs ordered are listed, but only abnormal results are displayed) Labs Reviewed - No data to display  EKG   Radiology No results found.  Procedures Procedures (including critical care time)  Medications Ordered in UC Medications - No  data to display  Initial Impression / Assessment and Plan / UC Course  I have reviewed the triage vital signs and the nursing notes.  Pertinent labs & imaging results that were available during my care of the patient were reviewed by me and considered in my medical decision making (see chart for details).     Patient is well-appearing, afebrile, nontoxic, nontachycardic.  I suspect rash is related to insect bites given presentation.  She was encouraged to keep the area clean and avoid scratching to prevent secondary infection.  Will use triamcinolone  up to twice a day as needed to help with irritation and pruritus.  We did discuss strategies to avoid insect bites including wearing clothing that covers her extremities as well as insect repellent.  We discussed that if her rash changes/spreads or if anything worsens and she develops additional symptoms she needs to be seen immediately.  I suspect that her breast pain is related to strain of Cooper's ligament, however, will obtain ultrasound and mammogram as it appears she is overdue for this based on EMR and to rule out any additional causes.  She was encouraged to use supportive undergarments and take over-the-counter medications for pain relief.  We discussed if anything worsens or changes she needs to be seen emergently.  Strict return precautions given.   Final Clinical Impressions(s) / UC Diagnoses   Final diagnoses:  Papular rash  Insect bite, unspecified site, initial encounter  Breast pain, right     Discharge Instructions      Apply triamcinolone  twice daily.  Keep these areas clean and avoid scratching them this can lead to infection.  I believe they are  insect bites so please wear long pants or use insect repellent when you are staying outside for long periods of time.  If you have any spread of rash or additional symptoms please return for reevaluation.  As we discussed, I believe that the pain in your breast is related to strain on the ligament.  Wear supportive bra and use Tylenol.  I do recommend that you obtain imaging to make sure that there is nothing else contributing to her symptoms.  Someone should call you to schedule these appointments and if you do not hear from them within a few days please let me know.  If anything worsens and you have increasing pain, lump in your breast, redness, fever you should be seen immediately.     ED Prescriptions     Medication Sig Dispense Auth. Provider   triamcinolone  cream (KENALOG ) 0.1 % Apply 1 Application topically 2 (two) times daily. 30 g Tyronn Golda K, PA-C      PDMP not reviewed this encounter.   Sherrell Rocky POUR, PA-C 01/22/24 1959

## 2024-01-22 NOTE — Discharge Instructions (Addendum)
 Apply triamcinolone  twice daily.  Keep these areas clean and avoid scratching them this can lead to infection.  I believe they are insect bites so please wear long pants or use insect repellent when you are staying outside for long periods of time.  If you have any spread of rash or additional symptoms please return for reevaluation.  As we discussed, I believe that the pain in your breast is related to strain on the ligament.  Wear supportive bra and use Tylenol.  I do recommend that you obtain imaging to make sure that there is nothing else contributing to her symptoms.  Someone should call you to schedule these appointments and if you do not hear from them within a few days please let me know.  If anything worsens and you have increasing pain, lump in your breast, redness, fever you should be seen immediately.

## 2024-02-21 DIAGNOSIS — Z452 Encounter for adjustment and management of vascular access device: Secondary | ICD-10-CM | POA: Diagnosis not present

## 2024-02-21 DIAGNOSIS — Z4682 Encounter for fitting and adjustment of non-vascular catheter: Secondary | ICD-10-CM | POA: Diagnosis not present

## 2024-02-21 DIAGNOSIS — R140 Abdominal distension (gaseous): Secondary | ICD-10-CM | POA: Diagnosis not present

## 2024-02-25 DIAGNOSIS — Z4682 Encounter for fitting and adjustment of non-vascular catheter: Secondary | ICD-10-CM | POA: Diagnosis not present

## 2024-02-27 DIAGNOSIS — Z4682 Encounter for fitting and adjustment of non-vascular catheter: Secondary | ICD-10-CM | POA: Diagnosis not present

## 2024-02-29 DIAGNOSIS — Q620 Congenital hydronephrosis: Secondary | ICD-10-CM | POA: Diagnosis not present

## 2024-02-29 DIAGNOSIS — N19 Unspecified kidney failure: Secondary | ICD-10-CM | POA: Diagnosis not present

## 2024-03-02 DIAGNOSIS — R140 Abdominal distension (gaseous): Secondary | ICD-10-CM | POA: Diagnosis not present

## 2024-03-02 DIAGNOSIS — Z452 Encounter for adjustment and management of vascular access device: Secondary | ICD-10-CM | POA: Diagnosis not present

## 2024-03-08 DIAGNOSIS — Q048 Other specified congenital malformations of brain: Secondary | ICD-10-CM | POA: Diagnosis not present

## 2024-03-14 DIAGNOSIS — Z4682 Encounter for fitting and adjustment of non-vascular catheter: Secondary | ICD-10-CM | POA: Diagnosis not present

## 2024-03-14 DIAGNOSIS — R140 Abdominal distension (gaseous): Secondary | ICD-10-CM | POA: Diagnosis not present

## 2024-03-15 DIAGNOSIS — Z4682 Encounter for fitting and adjustment of non-vascular catheter: Secondary | ICD-10-CM | POA: Diagnosis not present

## 2024-03-15 DIAGNOSIS — R140 Abdominal distension (gaseous): Secondary | ICD-10-CM | POA: Diagnosis not present

## 2024-03-22 DIAGNOSIS — R140 Abdominal distension (gaseous): Secondary | ICD-10-CM | POA: Diagnosis not present

## 2024-03-22 DIAGNOSIS — Z452 Encounter for adjustment and management of vascular access device: Secondary | ICD-10-CM | POA: Diagnosis not present

## 2024-03-22 DIAGNOSIS — R0989 Other specified symptoms and signs involving the circulatory and respiratory systems: Secondary | ICD-10-CM | POA: Diagnosis not present

## 2024-03-24 DIAGNOSIS — Z452 Encounter for adjustment and management of vascular access device: Secondary | ICD-10-CM | POA: Diagnosis not present

## 2024-04-01 ENCOUNTER — Telehealth (INDEPENDENT_AMBULATORY_CARE_PROVIDER_SITE_OTHER): Payer: Self-pay | Admitting: Primary Care

## 2024-04-01 DIAGNOSIS — E1165 Type 2 diabetes mellitus with hyperglycemia: Secondary | ICD-10-CM

## 2024-04-01 NOTE — Telephone Encounter (Signed)
 Error

## 2024-04-01 NOTE — Telephone Encounter (Signed)
 Copied from CRM [id number]. Topic: Clinical - Prescription Issue >> Apr 01, 2024 10:33 AM Cleave MATSU wrote: Reason for CRM: pt needs Dr.edwards  to fax over paperwork that pharmacy sent over to authorize the prescription for test strips

## 2024-04-02 MED ORDER — GLUCOSE BLOOD VI STRP: ORAL_STRIP | 12 refills | Status: DC

## 2024-04-02 NOTE — Addendum Note (Signed)
 Addended by: Nevin Kozuch on: 04/02/2024 03:49 PM   Modules accepted: Orders

## 2024-04-05 ENCOUNTER — Telehealth (INDEPENDENT_AMBULATORY_CARE_PROVIDER_SITE_OTHER): Payer: Self-pay | Admitting: Primary Care

## 2024-04-05 NOTE — Telephone Encounter (Signed)
 Copied from CRM [id number]. Topic: Clinical - Prescription Issue >> Apr 05, 2024 12:44 PM Melody Crawford wrote: Pt states her insurance will not cover RX (glucose blood test strip) because the instructions only says As Directed. Insurance is requiring more details

## 2024-04-07 ENCOUNTER — Telehealth: Payer: Self-pay

## 2024-04-07 NOTE — Telephone Encounter (Signed)
 Able to call pharmacy and gave VO for test strips.  Use BID.

## 2024-04-07 NOTE — Telephone Encounter (Signed)
 Left message on voicemail informing the directions have been sent to the pharmacy for test strips.  To take BID.   Can call if she has additional questions.

## 2024-04-07 NOTE — Telephone Encounter (Signed)
 Copied from CRM [id number]. Topic: Clinical - Prescription Issue >> Apr 07, 2024  2:11 PM Melody Crawford wrote: Reason for CRM: Patient called upset and frustrated, stating she has called multiple times requesting that her provider, Rosaline Bohr, send over detailed directions for her blood test strips to the pharmacy. She reported that the only directions currently on file are use as directed, and her insurance company informed her this is unacceptable. Patient is requesting that the directions be corrected and sent to the pharmacy as soon as possible. She also requested a call back with confirmation once this has been completed.  Callback number: [phone].

## 2024-04-09 ENCOUNTER — Other Ambulatory Visit (INDEPENDENT_AMBULATORY_CARE_PROVIDER_SITE_OTHER): Payer: Self-pay | Admitting: Primary Care

## 2024-04-09 DIAGNOSIS — E1165 Type 2 diabetes mellitus with hyperglycemia: Secondary | ICD-10-CM

## 2024-04-09 NOTE — Telephone Encounter (Signed)
 Copied from CRM [id number]. Topic: Clinical - Medication Refill >> Apr 09, 2024 12:31 PM Tiffini S wrote: Medication: glipiZIDE  (GLUCOTROL  XL) 10 MG 24 hr tablet Semaglutide  (RYBELSUS ) 14 MG TABS  Accu- Chek Meters, 100 Test Strips and Lancets  Has the patient contacted their pharmacy? No (Agent: If no, request that the patient contact the pharmacy for the refill. If patient does not wish to contact the pharmacy document the reason why and proceed with request.) (Agent: If yes, when and what did the pharmacy advise?)  This is the patient's preferred pharmacy:  [facility] STORE #[id number] [facility], [place] - [address] AT [facility] [address] [place] [address] Phone: [phone] Fax: [phone]  Is this the correct pharmacy for this prescription? Yes If no, delete pharmacy and type the correct one.   Has the prescription been filled recently? Yes  Is the patient out of the medication? Yes  Has the patient been seen for an appointment in the last year OR does the patient have an upcoming appointment? Yes  Can we respond through MyChart? No, please call the patient at [phone]  Agent: Please be advised that Rx refills may take up to 3 business days. We ask that you follow-up with your pharmacy.

## 2024-04-09 NOTE — Telephone Encounter (Signed)
 Requested medications are due for refill today.  unsure  Requested medications are on the active medications list.  Yes - as historical  Last refill. 11/06/2022  Future visit scheduled.   A wellness visit is scheduled  Notes to clinic.  Pt is also requesting Lancets and a new blood glucose monitor.    Requested Prescriptions  Pending Prescriptions Disp Refills   Semaglutide  (RYBELSUS ) 14 MG TABS 30 tablet     Sig: Take 1 tablet (14 mg total) by mouth daily.     Off-Protocol Failed - 04/09/2024  5:11 PM      Failed - Medication not assigned to a protocol, review manually.      Passed - Valid encounter within last 12 months    Recent Outpatient Visits           5 months ago Uncontrolled type 2 diabetes mellitus with hyperglycemia, without long-term current use of insulin (HCC)   [place] [facility] Celestia Rosaline SQUIBB, NP   7 months ago Cervical cancer screening   [place] [facility] Celestia Rosaline SQUIBB, NP   8 months ago Need for hepatitis C screening test   [place] [facility] Celestia Rosaline SQUIBB, NP              Refused Prescriptions Disp Refills   glipiZIDE  (GLUCOTROL  XL) 10 MG 24 hr tablet 90 tablet 1    Sig: Take 1 tablet (10 mg total) by mouth daily with breakfast.     Endocrinology:  Diabetes - Sulfonylureas Failed - 04/09/2024  5:11 PM      Failed - HBA1C is between 0 and 7.9 and within 180 days    HbA1c, POC (controlled diabetic range)  Date Value Ref Range Status  10/27/2023 12.4 (A) 0.0 - 7.0 % Final         Passed - Cr in normal range and within 360 days    Creatinine, Ser  Date Value Ref Range Status  07/29/2023 0.60 0.57 - 1.00 mg/dL Final         Passed - Valid encounter within last 6 months    Recent Outpatient Visits           5 months ago Uncontrolled type 2 diabetes mellitus with hyperglycemia, without long-term current use of insulin (HCC)   [place] [facility]  Celestia Rosaline SQUIBB, NP   7 months ago Cervical cancer screening   [place] [facility] Celestia Rosaline SQUIBB, NP   8 months ago Need for hepatitis C screening test   [place] [facility] Celestia Rosaline P, NP               glucose blood test strip 100 each 12    Sig: Use as instructed     Endocrinology: Diabetes - Testing Supplies Passed - 04/09/2024  5:11 PM      Passed - Valid encounter within last 12 months    Recent Outpatient Visits           5 months ago Uncontrolled type 2 diabetes mellitus with hyperglycemia, without long-term current use of insulin (HCC)   [place] [facility] Celestia Rosaline SQUIBB, NP   7 months ago Cervical cancer screening   [place] [facility] Celestia Rosaline SQUIBB, NP   8 months ago Need for hepatitis C screening test   [facility] Celestia Rosaline SQUIBB, NP

## 2024-04-09 NOTE — Telephone Encounter (Signed)
 Requested Prescriptions  Pending Prescriptions Disp Refills   Semaglutide  (RYBELSUS ) 14 MG TABS 30 tablet     Sig: Take 1 tablet (14 mg total) by mouth daily.     Off-Protocol Failed - 04/09/2024  5:10 PM      Failed - Medication not assigned to a protocol, review manually.      Passed - Valid encounter within last 12 months    Recent Outpatient Visits           5 months ago Uncontrolled type 2 diabetes mellitus with hyperglycemia, without long-term current use of insulin (HCC)   [place] [facility] Celestia Rosaline SQUIBB, NP   7 months ago Cervical cancer screening   [place] [facility] Celestia Rosaline SQUIBB, NP   8 months ago Need for hepatitis C screening test   [place] [facility] Celestia Rosaline SQUIBB, NP              Refused Prescriptions Disp Refills   glipiZIDE  (GLUCOTROL  XL) 10 MG 24 hr tablet 90 tablet 1    Sig: Take 1 tablet (10 mg total) by mouth daily with breakfast.     Endocrinology:  Diabetes - Sulfonylureas Failed - 04/09/2024  5:10 PM      Failed - HBA1C is between 0 and 7.9 and within 180 days    HbA1c, POC (controlled diabetic range)  Date Value Ref Range Status  10/27/2023 12.4 (A) 0.0 - 7.0 % Final         Passed - Cr in normal range and within 360 days    Creatinine, Ser  Date Value Ref Range Status  07/29/2023 0.60 0.57 - 1.00 mg/dL Final         Passed - Valid encounter within last 6 months    Recent Outpatient Visits           5 months ago Uncontrolled type 2 diabetes mellitus with hyperglycemia, without long-term current use of insulin (HCC)   [place] [facility] Celestia Rosaline SQUIBB, NP   7 months ago Cervical cancer screening   [place] [facility] Celestia Rosaline SQUIBB, NP   8 months ago Need for hepatitis C screening test   [place] [facility] Celestia Rosaline P, NP               glucose blood test strip 100 each 12     Sig: Use as instructed     Endocrinology: Diabetes - Testing Supplies Passed - 04/09/2024  5:10 PM      Passed - Valid encounter within last 12 months    Recent Outpatient Visits           5 months ago Uncontrolled type 2 diabetes mellitus with hyperglycemia, without long-term current use of insulin (HCC)   [place] [facility] Celestia Rosaline SQUIBB, NP   7 months ago Cervical cancer screening   [place] [facility] Celestia Rosaline SQUIBB, NP   8 months ago Need for hepatitis C screening test   [facility] Celestia Rosaline SQUIBB, NP

## 2024-04-12 ENCOUNTER — Telehealth: Payer: Self-pay

## 2024-04-12 DIAGNOSIS — E11621 Type 2 diabetes mellitus with foot ulcer: Secondary | ICD-10-CM

## 2024-04-12 NOTE — Telephone Encounter (Signed)
 Copied from CRM #[id number]. Topic: Clinical - Prescription Issue >> Apr 12, 2024  4:09 PM Edsel HERO wrote: Patient states that she needs a new glucose meter kit. Patient states that her insurance with cover Accuchek. Please send to. [id number] [place] [place], [place] - [address]  Phone: [phone] Fax: [phone]

## 2024-04-13 MED ORDER — ACCU-CHEK FASTCLIX LANCETS MISC
1.0000 | Freq: Two times a day (BID) | 3 refills | Status: DC
Start: 2024-04-13 — End: 2024-04-13

## 2024-04-13 MED ORDER — ACCU-CHEK FASTCLIX LANCET KIT: 1.0000 | PACK | Freq: Two times a day (BID) | 3 refills | Status: AC

## 2024-04-13 MED ORDER — ACCU-CHEK GUIDE TEST VI STRP: ORAL_STRIP | 3 refills | Status: DC

## 2024-04-13 MED ORDER — ACCU-CHEK GUIDE W/DEVICE KIT: 1.0000 | PACK | Freq: Two times a day (BID) | 0 refills | Status: AC

## 2024-04-13 MED ORDER — ACCU-CHEK SOFTCLIX LANCETS MISC: 3 refills | Status: DC

## 2024-04-13 NOTE — Addendum Note (Signed)
 Addended by: Aracely Rickett on: 04/13/2024 03:46 PM   Modules accepted: Orders

## 2024-04-13 NOTE — Addendum Note (Signed)
 Addended by: Phillips Goulette on: 04/13/2024 03:05 PM   Modules accepted: Orders

## 2024-04-13 NOTE — Addendum Note (Signed)
 Addended by: Versia Mignogna on: 04/13/2024 02:33 PM   Modules accepted: Orders

## 2024-04-13 NOTE — Telephone Encounter (Signed)
 Copied from CRM [id number]. Topic: General - Call Back - No Documentation >> Apr 13, 2024  3:09 PM Wess RAMAN wrote:  Reason for CRM: Patient missed call from Celestia Browning, NP and would like a call back  Callback #: [phone]

## 2024-04-22 DIAGNOSIS — F251 Schizoaffective disorder, depressive type: Secondary | ICD-10-CM | POA: Diagnosis not present

## 2024-05-03 ENCOUNTER — Ambulatory Visit (INDEPENDENT_AMBULATORY_CARE_PROVIDER_SITE_OTHER): Admitting: Primary Care

## 2024-05-05 ENCOUNTER — Encounter (INDEPENDENT_AMBULATORY_CARE_PROVIDER_SITE_OTHER): Payer: Self-pay | Admitting: Primary Care

## 2024-05-05 ENCOUNTER — Ambulatory Visit (INDEPENDENT_AMBULATORY_CARE_PROVIDER_SITE_OTHER): Admitting: Primary Care

## 2024-05-05 VITALS — BP 115/81 | HR 106 | Resp 16 | Wt 181.4 lb

## 2024-05-05 DIAGNOSIS — Z23 Encounter for immunization: Secondary | ICD-10-CM

## 2024-05-05 DIAGNOSIS — Z0184 Encounter for antibody response examination: Secondary | ICD-10-CM

## 2024-05-05 DIAGNOSIS — Z7984 Long term (current) use of oral hypoglycemic drugs: Secondary | ICD-10-CM

## 2024-05-05 DIAGNOSIS — E1165 Type 2 diabetes mellitus with hyperglycemia: Secondary | ICD-10-CM | POA: Diagnosis not present

## 2024-05-05 DIAGNOSIS — E782 Mixed hyperlipidemia: Secondary | ICD-10-CM

## 2024-05-05 LAB — POCT URINALYSIS DIP (CLINITEK)
Bilirubin, UA: NEGATIVE
Blood, UA: NEGATIVE
Glucose, UA: >=1000 mg/dL — AB
Leukocytes, UA: NEGATIVE
Nitrite, UA: NEGATIVE
POC PROTEIN,UA: NEGATIVE
Spec Grav, UA: 1.02 (ref 1.010–1.025)
Urobilinogen, UA: 0.2 U/dL
pH, UA: 6 (ref 5.0–8.0)

## 2024-05-05 LAB — GLUCOSE, POCT (MANUAL RESULT ENTRY): POC Glucose: 371 mg/dL — AB (ref 70–99)

## 2024-05-05 LAB — POCT GLYCOSYLATED HEMOGLOBIN (HGB A1C): HbA1c, POC (controlled diabetic range): 11.9 % — AB (ref 0.0–7.0)

## 2024-05-05 NOTE — Progress Notes (Signed)
 Today [facility]  Melody Crawford, is a 48 y.o. female  RDW:[id number]  FMW:[id number]  DOB - [birth year]  Chief Complaint  Patient presents with   Diabetes       Subjective:   Melody Crawford is a 48 y.o. female here today for a follow up visit.  For the management of diabetes.  Micro albumin was positive for ketones explained to patient what that meant and she may need to drink water while she was here and blood sugar was elevated also needed insulin.  Patient refused stated she felt okay nothing was wrong her it was the warms that are in her pancreas that causes her blood sugar to be elevated.  Refused treatment patient is not alert and oriented x 3 to complete AMA patient has No headache, No chest pain, No abdominal pain - No Nausea, No new weakness tingling or numbness, No Cough - shortness of breath HPI  No problems updated.  Comprehensive ROS Pertinent positive and negative noted in HPI   No Known Allergies  Past Medical History:  Diagnosis Date   Chronic back pain    Diabetes mellitus without complication (HCC)    Hyperlipidemia    Schizophrenia (HCC)     Current Outpatient Medications on File Prior to Visit  Medication Sig Dispense Refill   atorvastatin  (LIPITOR) 80 MG tablet Take 1 tablet (80 mg total) by mouth daily. 90 tablet 3   Blood Glucose Monitoring Suppl (ACCU-CHEK GUIDE) w/Device KIT 1 Device by Does not apply route 2 (two) times daily. 1 kit 0   glipiZIDE  (GLUCOTROL  XL) 10 MG 24 hr tablet Take 1 tablet (10 mg total) by mouth daily with breakfast. 90 tablet 1   hydrOXYzine (ATARAX) 25 MG tablet Take 25 mg by mouth 3 (three) times daily as needed.     INVEGA SUSTENNA 156 MG/ML SUSY injection Inject 156 mg into the muscle as directed.     Lancets Misc. (ACCU-CHEK FASTCLIX LANCET) KIT 1 Lancet by Does not apply route 2 (two) times daily. 100 kit 3   mirtazapine (REMERON) 7.5 MG tablet Take 7.5 mg by mouth at bedtime.     Semaglutide  (RYBELSUS ) 14 MG  TABS Take 1 tablet by mouth daily.     triamcinolone  cream (KENALOG ) 0.1 % Apply 1 Application topically 2 (two) times daily. 30 g 0   No current facility-administered medications on file prior to visit.   Health Maintenance  Topic Date Due   COVID-19 Vaccine (1) Never done   Hepatitis B Vaccine (1 of 3 - 19+ 3-dose series) Never done   Breast Cancer Screening  Never done   Medicare Annual Wellness Visit  02/22/2023   Yearly kidney health urinalysis for diabetes  07/28/2024   Yearly kidney function blood test for diabetes  05/05/2025   Cologuard (Stool DNA test)  08/08/2026   Pap with HPV screening  08/31/2028   DTaP/Tdap/Td vaccine (3 - Td or Tdap) 07/29/2033   Pneumococcal Vaccine  Completed   Flu Shot  Completed   Hepatitis C Screening  Completed   HIV Screening  Completed   HPV Vaccine  Aged Out   Meningitis B Vaccine  Aged Out    Objective:   Vitals:   05/05/24 0921  BP: 115/81  Pulse: (!) 106  Resp: 16  SpO2: 96%  Weight: 181 lb 6.4 oz (82.3 kg)   BP Readings from Last 3 Encounters:  05/05/24 115/81  01/22/24 123/74  10/27/23 106/67      Physical Exam  Vitals reviewed.  Constitutional:      Appearance: Normal appearance. She is obese.  HENT:     Head: Normocephalic.     Right Ear: Tympanic membrane, ear canal and external ear normal.     Left Ear: Tympanic membrane, ear canal and external ear normal.     Nose: Nose normal.     Mouth/Throat:     Mouth: Mucous membranes are moist.  Eyes:     Extraocular Movements: Extraocular movements intact.     Pupils: Pupils are equal, round, and reactive to light.  Cardiovascular:     Rate and Rhythm: Normal rate.  Pulmonary:     Effort: Pulmonary effort is normal.     Breath sounds: Normal breath sounds.  Abdominal:     General: Bowel sounds are normal.     Palpations: Abdomen is soft.  Musculoskeletal:        General: Normal range of motion.     Cervical back: Normal range of motion.  Skin:    General: Skin  is warm and dry.  Neurological:     Mental Status: She is alert and oriented to person, place, and time.  Psychiatric:        Mood and Affect: Mood normal.        Behavior: Behavior normal.        Thought Content: Thought content normal.       Assessment & Plan  Melody Crawford was seen today for diabetes.  Diagnoses and all orders for this visit:  Uncontrolled type 2 diabetes mellitus with hyperglycemia, without long-term current use of insulin (HCC) -     POCT glucose (manual entry) -     POCT glycosylated hemoglobin (Hb A1C) -     POCT URINALYSIS DIP (CLINITEK) -     CBC with Differential/Platelet -     CMP14+EGFR -     Lipid panel  Encounter for immunization -     Flu vaccine trivalent PF, 6mos and older(Flulaval,Afluria,Fluarix,Fluzone)  Mixed hyperlipidemia -     Lipid panel  Immunity status testing -     Hepatitis B surface antibody,qualitative    Patient have been counseled extensively about nutrition and exercise. Other issues discussed during this visit include: low cholesterol diet, weight control and daily exercise, foot care, annual eye examinations at Ophthalmology, importance of adherence with medications and regular follow-up. We also discussed long term complications of uncontrolled diabetes and hypertension.   No follow-ups on file.  The patient was given clear instructions to go to ER or return to medical center if symptoms don't improve, worsen or new problems develop. The patient verbalized understanding. The patient was told to call to get lab results if they haven't heard anything in the next week.   This note has been created with Education officer, environmental. Any transcriptional errors are unintentional.   Melody SHAUNNA Bohr, NP 05/13/2024, 5:09 PM

## 2024-05-05 NOTE — Patient Instructions (Signed)

## 2024-05-06 ENCOUNTER — Telehealth: Payer: Self-pay | Admitting: Primary Care

## 2024-05-06 LAB — CBC WITH DIFFERENTIAL/PLATELET
Basophils Absolute: 0 x10E3/uL (ref 0.0–0.2)
Basos: 1 %
EOS (ABSOLUTE): 0 x10E3/uL (ref 0.0–0.4)
Eos: 0 %
Hematocrit: 45.6 % (ref 34.0–46.6)
Hemoglobin: 14.6 g/dL (ref 11.1–15.9)
Immature Grans (Abs): 0 x10E3/uL (ref 0.0–0.1)
Immature Granulocytes: 0 %
Lymphocytes Absolute: 2.3 x10E3/uL (ref 0.7–3.1)
Lymphs: 36 %
MCH: 30.6 pg (ref 26.6–33.0)
MCHC: 32 g/dL (ref 31.5–35.7)
MCV: 96 fL (ref 79–97)
Monocytes Absolute: 0.7 x10E3/uL (ref 0.1–0.9)
Monocytes: 10 %
Neutrophils Absolute: 3.4 x10E3/uL (ref 1.4–7.0)
Neutrophils: 53 %
Platelets: 272 x10E3/uL (ref 150–450)
RBC: 4.77 x10E6/uL (ref 3.77–5.28)
RDW: 12 % (ref 11.7–15.4)
WBC: 6.5 x10E3/uL (ref 3.4–10.8)

## 2024-05-06 LAB — LIPID PANEL
Chol/HDL Ratio: 6.5 ratio — ABNORMAL HIGH (ref 0.0–4.4)
Cholesterol, Total: 320 mg/dL — ABNORMAL HIGH (ref 100–199)
HDL: 49 mg/dL (ref >39)
LDL Chol Calc (NIH): 242 mg/dL — ABNORMAL HIGH (ref 0–99)
Triglycerides: 153 mg/dL — ABNORMAL HIGH (ref 0–149)
VLDL Cholesterol Cal: 29 mg/dL (ref 5–40)

## 2024-05-06 LAB — CMP14+EGFR
ALT: 21 IU/L (ref 0–32)
AST: 16 IU/L (ref 0–40)
Albumin: 4.3 g/dL (ref 3.9–4.9)
Alkaline Phosphatase: 121 IU/L — ABNORMAL HIGH (ref 41–116)
BUN/Creatinine Ratio: 16 (ref 9–23)
BUN: 12 mg/dL (ref 6–24)
Bilirubin Total: 0.3 mg/dL (ref 0.0–1.2)
CO2: 22 mmol/L (ref 20–29)
Calcium: 9.1 mg/dL (ref 8.7–10.2)
Chloride: 97 mmol/L (ref 96–106)
Creatinine, Ser: 0.74 mg/dL (ref 0.57–1.00)
Globulin, Total: 2.7 g/dL (ref 1.5–4.5)
Glucose: 342 mg/dL — ABNORMAL HIGH (ref 70–99)
Potassium: 4.5 mmol/L (ref 3.5–5.2)
Sodium: 138 mmol/L (ref 134–144)
Total Protein: 7 g/dL (ref 6.0–8.5)
eGFR: 100 mL/min/1.73 (ref >59)

## 2024-05-06 LAB — HEPATITIS B SURFACE ANTIBODY,QUALITATIVE: Hep B Surface Ab, Qual: NONREACTIVE

## 2024-05-06 NOTE — Telephone Encounter (Signed)
 Copied from CRM #[id number]. Topic: Clinical - Medication Question >> May 06, 2024  2:08 PM Pinkey ORN wrote:  Reason for CRM: Requesting A Call Back  >> May 06, 2024  2:09 PM Pinkey ORN wrote:  Patient is requesting to speak with Celestia Rosaline SQUIBB, NP nurse, states it's in regards to her lancet and test strips. Please follow up with the patient.

## 2024-05-07 NOTE — Telephone Encounter (Signed)
 Rx were sent on 04/13/24

## 2024-05-10 ENCOUNTER — Telehealth (INDEPENDENT_AMBULATORY_CARE_PROVIDER_SITE_OTHER): Payer: Self-pay | Admitting: Primary Care

## 2024-05-10 NOTE — Telephone Encounter (Unsigned)
 Copied from CRM #[id number]. Topic: Clinical - Medication Refill >> May 10, 2024 10:49 AM Tiffini S wrote: Medication: glucose blood (ACCU-CHEK GUIDE TEST) test strip, Accu-Chek Softclix Lancets lancets  Has the patient contacted their pharmacy? Yes (Agent: If no, request that the patient contact the pharmacy for the refill. If patient does not wish to contact the pharmacy document the reason why and proceed with request.) (Agent: If yes, when and what did the pharmacy advise?)  This is the patient's preferred pharmacy:  [facility] #[id number], [place] - [address] AT [facility] [address] [place] [address] Phone: [phone] Fax: [phone]  Is this the correct pharmacy for this prescription? Yes If no, delete pharmacy and type the correct one.   Has the prescription been filled recently? Yes  Is the patient out of the medication? No  Has the patient been seen for an appointment in the last year OR does the patient have an upcoming appointment? Yes  Can we respond through MyChart? No, please call at [phone]  Agent: Please be advised that Rx refills may take up to 3 business days. We ask that you follow-up with your pharmacy.

## 2024-05-10 NOTE — Telephone Encounter (Unsigned)
 Copied from CRM #[id number]. Topic: Clinical - Medication Question >> May 10, 2024  3:15 PM Montie POUR wrote: Melody Crawford is calling again about NP Edwards calling in her Accu-Chek Softclix Lancets lancets and glucose blood (ACCU-CHEK GUIDE TEST) test strip. She wants orders for both to state that she needs to check 4 times daily.  Please call her at [phone] to discuss the orders. She is testing more since her sugar is up and she talked to NP Celestia about this at her last appointment.

## 2024-05-11 NOTE — Telephone Encounter (Signed)
 Will forward to provider

## 2024-05-12 ENCOUNTER — Other Ambulatory Visit (INDEPENDENT_AMBULATORY_CARE_PROVIDER_SITE_OTHER): Payer: Self-pay | Admitting: Primary Care

## 2024-05-12 DIAGNOSIS — E119 Type 2 diabetes mellitus without complications: Secondary | ICD-10-CM

## 2024-05-12 MED ORDER — BLOOD GLUCOSE TEST VI STRP: 1.0000 | ORAL_STRIP | Freq: Three times a day (TID) | 0 refills | Status: AC

## 2024-05-12 MED ORDER — ACCU-CHEK SOFTCLIX LANCETS MISC: 3 refills | Status: AC

## 2024-05-17 ENCOUNTER — Ambulatory Visit (INDEPENDENT_AMBULATORY_CARE_PROVIDER_SITE_OTHER): Payer: Self-pay | Admitting: Primary Care

## 2024-05-25 ENCOUNTER — Ambulatory Visit (INDEPENDENT_AMBULATORY_CARE_PROVIDER_SITE_OTHER)

## 2024-05-25 ENCOUNTER — Encounter (INDEPENDENT_AMBULATORY_CARE_PROVIDER_SITE_OTHER): Payer: Self-pay

## 2024-05-25 VITALS — Ht 63.0 in | Wt 181.0 lb

## 2024-05-25 DIAGNOSIS — Z7984 Long term (current) use of oral hypoglycemic drugs: Secondary | ICD-10-CM | POA: Diagnosis not present

## 2024-05-25 DIAGNOSIS — E119 Type 2 diabetes mellitus without complications: Secondary | ICD-10-CM

## 2024-05-25 DIAGNOSIS — Z0000 Encounter for general adult medical examination without abnormal findings: Secondary | ICD-10-CM

## 2024-05-25 NOTE — Progress Notes (Addendum)
 Subjective:   Melody Crawford is a 48 y.o. female who presents for a Medicare Annual Wellness Visit.  on 05/25/24 by a audio enabled telemedicine application and verified that I am speaking with the correct person using two identifiers.  Patient Location: Home  Provider Location: Office/Clinic  Persons Participating in Visit: Patient.  I discussed the limitations of evaluation and management by telemedicine. The patient expressed understanding and agreed to proceed.  Vital Signs: Because this visit was a virtual/telehealth visit, some criteria may be missing or patient reported. Any vitals not documented were not able to be obtained and vitals that have been documented are patient reported.  Allergies (verified) Patient has no known allergies.   History: Past Medical History:  Diagnosis Date   Chronic back pain    Diabetes mellitus without complication (HCC)    Hyperlipidemia    Schizophrenia (HCC)    Past Surgical History:  Procedure Laterality Date   ABDOMINAL HYSTERECTOMY     partial   BACK SURGERY     Family History  Problem Relation Age of Onset   Kidney disease Father    Social History   Occupational History   Occupation: disabilty  Tobacco Use   Smoking status: Former    Current packs/day: 1.00    Types: Cigarettes   Smokeless tobacco: Never  Vaping Use   Vaping status: Never Used  Substance and Sexual Activity   Alcohol use: Not Currently   Drug use: Never   Sexual activity: Yes    Birth control/protection: None   Tobacco Counseling Counseling given: Not Answered  SDOH Screenings   Food Insecurity: Food Insecurity Present (05/25/2024)  Housing: High Risk (05/25/2024)  Transportation Needs: No Transportation Needs (05/25/2024)  Utilities: Not At Risk (05/25/2024)  Depression (PHQ2-9): Medium Risk (05/25/2024)  Physical Activity: Inactive (05/25/2024)  Social Connections: Moderately Isolated (05/25/2024)  Stress: Stress Concern Present (05/25/2024)   Tobacco Use: Medium Risk (05/25/2024)  Health Literacy: Adequate Health Literacy (05/25/2024)   Depression Screen    05/25/2024   10:20 AM 05/05/2024    9:20 AM 09/01/2023    9:57 AM 07/29/2023    9:04 AM  PHQ 2/9 Scores  PHQ - 2 Score 2  0 0  PHQ- 9 Score 5     Exception Documentation  Patient refusal       Goals Addressed             This Visit's Progress    Patient Stated       To stay healthy and prosopus/2025       Visit info / Clinical Intake: Medicare Wellness Visit Type:: Initial Annual Wellness Visit Medicare Wellness Visit Mode:: Telephone If telephone:: video declined If telephone or video:: vitals recorded from last visit Interpreter Needed?: No Pre-visit prep was completed: no AWV questionnaire completed by patient prior to visit?: no Living arrangements:: (!) lives alone Patient's Overall Health Status Rating: good Typical amount of pain: none Does pain affect daily life?: no Are you currently prescribed opioids?: no  Dietary Habits and Nutritional Risks How many meals a day?: 3 Eats fruit and vegetables daily?: yes Most meals are obtained by: eating out; preparing own meals Diabetic:: no  Functional Status Activities of Daily Living (to include ambulation/medication): Independent Ambulation: Independent with device- listed below Home Assistive Devices/Equipment: [place]; Eyeglasses Medication Administration: Independent Home Management: Independent Manage your own finances?: yes Primary transportation is: driving Concerns about vision?: no *vision screening is required for [facility]* (eye doctor/in [place]) Concerns about hearing?: no  Fall Screening Falls in the past year?: 1 Number of falls in past year: 0 Was there an injury with Fall?: 0 Fall Risk Category Calculator: 1 Patient Fall Risk Level: Low Fall Risk  Fall Risk Patient at Risk for Falls Due to: No Fall Risks Fall risk Follow up: Falls evaluation completed; Falls prevention  discussed  Home and Transportation Safety: All rugs have non-skid backing?: N/A, no rugs All stairs or steps have railings?: N/A, no stairs Grab bars in the bathtub or shower?: (!) no Have non-skid surface in bathtub or shower?: (!) no Good home lighting?: yes Regular seat belt use?: yes Hospital stays in the last year:: no  Cognitive Assessment Difficulty concentrating, remembering, or making decisions? : no Will 6CIT or Mini Cog be Completed: no 6CIT or Mini Cog Declined: patient alert, oriented, able to answer questions appropriately and recall recent events  Advance Directives (For Healthcare) Does Patient Have a Medical Advance Directive?: No Would patient like information on creating a medical advance directive?: No - Patient declined  Reviewed/Updated  Reviewed/Updated: All        Objective:    Today's Vitals   05/25/24 1005  Weight: 181 lb (82.1 kg)  Height: 5' 3 (1.6 m)   Body mass index is 32.06 kg/m.  Current Medications (verified) Outpatient Encounter Medications as of 05/25/2024  Medication Sig   Accu-Chek Softclix Lancets lancets Check glucose twice daily Phone in Rx to [facility]   atorvastatin  (LIPITOR) 80 MG tablet Take 1 tablet (80 mg total) by mouth daily.   Blood Glucose Monitoring Suppl (ACCU-CHEK GUIDE) w/Device KIT 1 Device by Does not apply route 2 (two) times daily.   glipiZIDE  (GLUCOTROL  XL) 10 MG 24 hr tablet Take 1 tablet (10 mg total) by mouth daily with breakfast.   Glucose Blood (BLOOD GLUCOSE TEST STRIPS) STRP 1 each by Does not apply route 3 (three) times daily with meals. May substitute to any manufacturer covered by patient's insurance.   hydrOXYzine (ATARAX) 25 MG tablet Take 25 mg by mouth 3 (three) times daily as needed.   INVEGA SUSTENNA 156 MG/ML SUSY injection Inject 156 mg into the muscle as directed.   Lancets Misc. (ACCU-CHEK FASTCLIX LANCET) KIT 1 Lancet by Does not apply route 2 (two) times daily.   mirtazapine  (REMERON) 7.5 MG tablet Take 7.5 mg by mouth at bedtime.   Semaglutide  (RYBELSUS ) 14 MG TABS Take 1 tablet by mouth daily.   triamcinolone  cream (KENALOG ) 0.1 % Apply 1 Application topically 2 (two) times daily.   No facility-administered encounter medications on file as of 05/25/2024.   Hearing/Vision screen Hearing Screening - Comments:: Denies hearing difficulties   Vision Screening - Comments:: [facility]/per pt-up to date Immunizations and Health Maintenance Health Maintenance  Topic Date Due   COVID-19 Vaccine (1) Never done   Hepatitis B Vaccines 19-59 Average Risk (1 of 3 - 19+ 3-dose series) Never done   Mammogram  Never done   Medicare Annual Wellness (AWV)  02/22/2023   Diabetic kidney evaluation - Urine ACR  07/28/2024   Diabetic kidney evaluation - eGFR measurement  05/05/2025   Fecal DNA (Cologuard)  08/08/2026   Cervical Cancer Screening (HPV/Pap Cotest)  08/31/2028   DTaP/Tdap/Td (3 - Td or Tdap) 07/29/2033   Pneumococcal Vaccine  Completed   Influenza Vaccine  Completed   Hepatitis C Screening  Completed   HIV Screening  Completed   HPV VACCINES  Aged Out   Meningococcal B Vaccine  Aged Out        Assessment/Plan:  This is a routine wellness examination for [facility].  Patient Care Team: Celestia Rosaline SQUIBB, NP as PCP - General (Internal Medicine) [facility]  I have personally reviewed and noted the following in the patient's chart:   Medical and social history Use of alcohol, tobacco or illicit drugs  Current medications and supplements including opioid prescriptions. Functional ability and status Nutritional status Physical activity Advanced directives List of other physicians Hospitalizations, surgeries, and ER visits in previous 12 months Vitals Screenings to include cognitive, depression, and falls Referrals and appointments  No orders of the defined types were placed in this encounter.  In addition, I have  reviewed and discussed with patient certain preventive protocols, quality metrics, and best practice recommendations. A written personalized care plan for preventive services as well as general preventive health recommendations were provided to patient.   Kohan Azizi L Genesia Caslin, CMA   05/25/2024   No follow-ups on file.  After Visit Summary: (MyChart) Due to this being a telephonic visit, the after visit summary with patients personalized plan was offered to patient via MyChart   Nurse Notes: patient is due for a mammogram, however, she stated that she is not in a hurry to have that done.  She stated that she would call the office to get scheduled for a follow up office visit.

## 2024-05-25 NOTE — Patient Instructions (Addendum)
 [name],  Thank you for taking the time for your Medicare Wellness Visit. I appreciate your continued commitment to your health goals. Please review the care plan we discussed, and feel free to reach out if I can assist you further.  Please note that Annual Wellness Visits do not include a physical exam. Some assessments may be limited, especially if the visit was conducted virtually. If needed, we may recommend an in-person follow-up with your provider.  Ongoing Care Seeing your primary care provider every 3 to 6 months helps us  monitor your health and provide consistent, personalized care. Last office visit on 05/05/2024.  Aim for 30 minutes of exercise or brisk walking, 6-8 glasses of water, and 5 servings of fruits and vegetables each day.   Referrals If a referral was made during today's visit and you haven't received any updates within two weeks, please contact the referred provider directly to check on the status.  Recommended Screenings:  Health Maintenance  Topic Date Due   COVID-19 Vaccine (1) Never done   Hepatitis B Vaccine (1 of 3 - 19+ 3-dose series) Never done   Breast Cancer Screening  Never done   Yearly kidney health urinalysis for diabetes  07/28/2024   Yearly kidney function blood test for diabetes  05/05/2025   Medicare Annual Wellness Visit  05/25/2025   Cologuard (Stool DNA test)  08/08/2026   Pap with HPV screening  08/31/2028   DTaP/Tdap/Td vaccine (3 - Td or Tdap) 07/29/2033   Pneumococcal Vaccine  Completed   Flu Shot  Completed   Hepatitis C Screening  Completed   HIV Screening  Completed   HPV Vaccine  Aged Out   Meningitis B Vaccine  Aged Out       05/25/2024   10:13 AM  Advanced Directives  Does Patient Have a Medical Advance Directive? No  Would patient like information on creating a medical advance directive? No - Patient declined    Vision: Annual vision screenings are recommended for early detection of glaucoma, cataracts, and diabetic  retinopathy. These exams can also reveal signs of chronic conditions such as diabetes and high blood pressure.  Dental: Annual dental screenings help detect early signs of oral cancer, gum disease, and other conditions linked to overall health, including heart disease and diabetes.  Please see the attached documents for additional preventive care recommendations.

## 2024-09-09 ENCOUNTER — Encounter (INDEPENDENT_AMBULATORY_CARE_PROVIDER_SITE_OTHER): Payer: Self-pay | Admitting: Primary Care
